# Patient Record
Sex: Female | Born: 1970 | Race: White | Hispanic: No | Marital: Married | Smoking: Former smoker
Health system: Southern US, Community
[De-identification: ages and names within clinical notes are randomized; demographics above are authoritative.]

## PROBLEM LIST (undated history)

## (undated) DIAGNOSIS — C801 Malignant (primary) neoplasm, unspecified: Secondary | ICD-10-CM

## (undated) DIAGNOSIS — F32A Depression, unspecified: Secondary | ICD-10-CM

## (undated) DIAGNOSIS — F329 Major depressive disorder, single episode, unspecified: Secondary | ICD-10-CM

## (undated) HISTORY — DX: Major depressive disorder, single episode, unspecified: F32.9

## (undated) HISTORY — DX: Depression, unspecified: F32.A

---

## 1997-07-10 ENCOUNTER — Other Ambulatory Visit: Admission: RE | Admit: 1997-07-10 | Discharge: 1997-07-10 | Payer: Self-pay | Admitting: Obstetrics and Gynecology

## 1997-10-22 ENCOUNTER — Ambulatory Visit (HOSPITAL_COMMUNITY): Admission: RE | Admit: 1997-10-22 | Discharge: 1997-10-22 | Payer: Self-pay | Admitting: Obstetrics & Gynecology

## 1998-03-13 ENCOUNTER — Inpatient Hospital Stay (HOSPITAL_COMMUNITY): Admission: AD | Admit: 1998-03-13 | Discharge: 1998-03-16 | Payer: Self-pay | Admitting: Obstetrics and Gynecology

## 1998-04-09 ENCOUNTER — Emergency Department (HOSPITAL_COMMUNITY): Admission: EM | Admit: 1998-04-09 | Discharge: 1998-04-09 | Payer: Self-pay | Admitting: Emergency Medicine

## 1999-03-12 ENCOUNTER — Other Ambulatory Visit: Admission: RE | Admit: 1999-03-12 | Discharge: 1999-03-12 | Payer: Self-pay | Admitting: Obstetrics and Gynecology

## 2000-06-30 ENCOUNTER — Other Ambulatory Visit: Admission: RE | Admit: 2000-06-30 | Discharge: 2000-06-30 | Payer: Self-pay | Admitting: Obstetrics and Gynecology

## 2001-08-18 ENCOUNTER — Other Ambulatory Visit: Admission: RE | Admit: 2001-08-18 | Discharge: 2001-08-18 | Payer: Self-pay | Admitting: Obstetrics and Gynecology

## 2002-09-19 ENCOUNTER — Inpatient Hospital Stay (HOSPITAL_COMMUNITY): Admission: AD | Admit: 2002-09-19 | Discharge: 2002-09-19 | Payer: Self-pay | Admitting: Obstetrics and Gynecology

## 2002-09-20 ENCOUNTER — Ambulatory Visit (HOSPITAL_COMMUNITY): Admission: RE | Admit: 2002-09-20 | Discharge: 2002-09-20 | Payer: Self-pay | Admitting: Obstetrics and Gynecology

## 2002-09-20 ENCOUNTER — Encounter: Payer: Self-pay | Admitting: Obstetrics and Gynecology

## 2002-10-17 ENCOUNTER — Inpatient Hospital Stay (HOSPITAL_COMMUNITY): Admission: AD | Admit: 2002-10-17 | Discharge: 2002-10-20 | Payer: Self-pay | Admitting: Obstetrics and Gynecology

## 2002-10-17 ENCOUNTER — Encounter (INDEPENDENT_AMBULATORY_CARE_PROVIDER_SITE_OTHER): Payer: Self-pay

## 2002-11-22 ENCOUNTER — Other Ambulatory Visit: Admission: RE | Admit: 2002-11-22 | Discharge: 2002-11-22 | Payer: Self-pay | Admitting: Obstetrics and Gynecology

## 2004-08-29 ENCOUNTER — Emergency Department (HOSPITAL_COMMUNITY): Admission: EM | Admit: 2004-08-29 | Discharge: 2004-08-30 | Payer: Self-pay | Admitting: Emergency Medicine

## 2005-12-15 ENCOUNTER — Ambulatory Visit: Payer: Self-pay | Admitting: Family Medicine

## 2006-02-13 ENCOUNTER — Encounter: Payer: Self-pay | Admitting: Family Medicine

## 2006-02-13 LAB — CONVERTED CEMR LAB: Pap Smear: NORMAL

## 2006-03-01 ENCOUNTER — Ambulatory Visit: Payer: Self-pay | Admitting: Family Medicine

## 2006-03-01 ENCOUNTER — Other Ambulatory Visit: Admission: RE | Admit: 2006-03-01 | Discharge: 2006-03-01 | Payer: Self-pay | Admitting: Family Medicine

## 2006-03-01 ENCOUNTER — Encounter: Payer: Self-pay | Admitting: Family Medicine

## 2006-04-05 ENCOUNTER — Ambulatory Visit: Payer: Self-pay | Admitting: Family Medicine

## 2006-04-05 LAB — CONVERTED CEMR LAB
AST: 27 units/L (ref 0–37)
BUN: 9 mg/dL (ref 6–23)
Calcium: 9.1 mg/dL (ref 8.4–10.5)
Chloride: 106 meq/L (ref 96–112)
Cholesterol: 193 mg/dL (ref 0–200)
Creatinine, Ser: 1 mg/dL (ref 0.4–1.2)
GFR calc Af Amer: 82 mL/min
GFR calc non Af Amer: 68 mL/min
HDL: 40.5 mg/dL (ref >39.0)
Potassium: 3.9 meq/L (ref 3.5–5.1)
TSH: 0.46 microintl units/mL (ref 0.35–5.50)
Triglycerides: 96 mg/dL (ref 0–149)
VLDL: 19 mg/dL (ref 0–40)

## 2006-04-22 ENCOUNTER — Ambulatory Visit: Payer: Self-pay | Admitting: Family Medicine

## 2006-10-07 ENCOUNTER — Encounter (INDEPENDENT_AMBULATORY_CARE_PROVIDER_SITE_OTHER): Payer: Self-pay | Admitting: *Deleted

## 2007-03-14 ENCOUNTER — Encounter: Payer: Self-pay | Admitting: Family Medicine

## 2007-03-14 DIAGNOSIS — F17200 Nicotine dependence, unspecified, uncomplicated: Secondary | ICD-10-CM

## 2007-03-14 DIAGNOSIS — Z720 Tobacco use: Secondary | ICD-10-CM | POA: Insufficient documentation

## 2007-03-18 ENCOUNTER — Ambulatory Visit: Payer: Self-pay | Admitting: Family Medicine

## 2008-05-23 ENCOUNTER — Ambulatory Visit: Payer: Self-pay | Admitting: Family Medicine

## 2008-12-20 ENCOUNTER — Ambulatory Visit: Payer: Self-pay | Admitting: Family Medicine

## 2009-04-16 ENCOUNTER — Ambulatory Visit: Payer: Self-pay | Admitting: Family Medicine

## 2009-04-16 LAB — HM PAP SMEAR

## 2009-04-16 LAB — CONVERTED CEMR LAB: Pap Smear: NORMAL

## 2009-07-08 ENCOUNTER — Ambulatory Visit: Payer: Self-pay | Admitting: Family Medicine

## 2009-07-08 LAB — CONVERTED CEMR LAB: CRP, High Sensitivity: 2.8 (ref 0.00–5.00)

## 2009-07-11 DIAGNOSIS — M329 Systemic lupus erythematosus, unspecified: Secondary | ICD-10-CM

## 2009-07-11 LAB — CONVERTED CEMR LAB
ANA Titer 1: 1:1280 {titer} — ABNORMAL HIGH
ENA SM Ab Ser-aCnc: <0.2 (ref <1.0)

## 2009-08-07 ENCOUNTER — Encounter: Payer: Self-pay | Admitting: Family Medicine

## 2009-11-22 ENCOUNTER — Telehealth (INDEPENDENT_AMBULATORY_CARE_PROVIDER_SITE_OTHER): Payer: Self-pay | Admitting: *Deleted

## 2009-11-22 ENCOUNTER — Ambulatory Visit: Payer: Self-pay | Admitting: Family Medicine

## 2009-11-23 LAB — CONVERTED CEMR LAB
ALT: 22 units/L (ref 0–35)
AST: 22 units/L (ref 0–37)
BUN: 9 mg/dL (ref 6–23)
Bilirubin, Direct: 0.1 mg/dL (ref 0.0–0.3)
CO2: 27 meq/L (ref 19–32)
Chloride: 106 meq/L (ref 96–112)
Cholesterol: 194 mg/dL (ref 0–200)
Creatinine, Ser: 0.7 mg/dL (ref 0.4–1.2)
Potassium: 4.1 meq/L (ref 3.5–5.1)
TSH: 0.64 microintl units/mL (ref 0.35–5.50)
Total Protein: 6.5 g/dL (ref 6.0–8.3)
Triglycerides: 103 mg/dL (ref 0.0–149.0)

## 2009-11-27 ENCOUNTER — Ambulatory Visit: Payer: Self-pay | Admitting: Family Medicine

## 2009-11-27 DIAGNOSIS — F341 Dysthymic disorder: Secondary | ICD-10-CM | POA: Insufficient documentation

## 2009-11-27 DIAGNOSIS — L989 Disorder of the skin and subcutaneous tissue, unspecified: Secondary | ICD-10-CM | POA: Insufficient documentation

## 2010-03-18 ENCOUNTER — Telehealth: Payer: Self-pay | Admitting: Family Medicine

## 2010-04-11 ENCOUNTER — Ambulatory Visit (HOSPITAL_COMMUNITY)
Admission: RE | Admit: 2010-04-11 | Discharge: 2010-04-11 | Payer: Self-pay | Source: Home / Self Care | Attending: Obstetrics and Gynecology | Admitting: Obstetrics and Gynecology

## 2010-04-11 LAB — BASIC METABOLIC PANEL
BUN: 7 mg/dL (ref 6–23)
Calcium: 9.2 mg/dL (ref 8.4–10.5)
GFR calc Af Amer: >60 mL/min (ref >60)
GFR calc non Af Amer: >60 mL/min (ref >60)
Sodium: 139 mEq/L (ref 135–145)

## 2010-04-11 LAB — CBC
HCT: 39.2 % (ref 36.0–46.0)
Hemoglobin: 13.2 g/dL (ref 12.0–15.0)
MCH: 30.2 pg (ref 26.0–34.0)
Platelets: 195 10*3/uL (ref 150–400)
RDW: 12.1 % (ref 11.5–15.5)

## 2010-04-11 LAB — APTT: aPTT: 30 seconds (ref 24–37)

## 2010-04-11 LAB — URINALYSIS, ROUTINE W REFLEX MICROSCOPIC
Bilirubin Urine: NEGATIVE
Nitrite: NEGATIVE
Protein, ur: NEGATIVE mg/dL
Urobilinogen, UA: 0.2 mg/dL (ref 0.0–1.0)

## 2010-04-11 LAB — PROTIME-INR: INR: 0.89 (ref 0.00–1.49)

## 2010-04-17 NOTE — Assessment & Plan Note (Signed)
Summary: SWELLING H ANDS/AND FEET/DLO   Vital Signs:  Patient profile:   40 year old female Height:      68 inches Weight:      173.8 pounds BMI:     26.52 Temp:     98.0 degrees F oral Pulse rate:   78 / minute Pulse rhythm:   regular BP sitting:   110 / 70  (left arm) Cuff size:   regular  Vitals Entered By: Benny Lennert CMA Duncan Dull) (July 08, 2009 10:50 AM)  History of Present Illness: Chief complaint swollen in hands and feet,achy joints(elbows,knees,ankles)  40 year old female:  Saturday -- fingers were swelling. Feet are swelling as well and she has some focal pain and bogginess in her MCP's and MTP joints.  Got some benadryl -- having pain This has improved somewhat since then, but still has some MCP pain No trauma or accident feet are swollen  FH, Mom with RA  Allergies (verified): No Known Drug Allergies  Past History:  Past medical, surgical, family and social histories (including risk factors) reviewed, and no changes noted (except as noted below).  Past Medical History: Reviewed history from 03/14/2007 and no changes required. Depression  Family History: Reviewed history and no changes required. Mother: Rheumatoid Arthritis  Denies FH of Psoriatic A, Inflammatory bowel disease, Lupus -- but mom's Lupus tests are mildly positive (mild ANA?), No seronegative spondyloarthropathies known  Social History: Reviewed history from 03/14/2007 and no changes required. Current Smoker, 1 PPD Alcohol use-yes, occasional Regular exercise-no Marital Status: Children:  Occupation:   Review of Systems General:  Denies chills and fever. MS:  Complains of joint pain, joint swelling, and stiffness; denies joint redness.  Physical Exam  General:  GEN: Well-developed,well-nourished,in no acute distress; alert,appropriate and cooperative throughout examination HEENT: Normocephalic and atraumatic without obvious abnormalities. No apparent alopecia or balding. Ears,  externally no deformities PULM: Breathing comfortably in no respiratory distress EXT: No clubbing, cyanosis, or edema PSYCH: Normally interactive. Cooperative during the interview. Pleasant. Friendly and conversant. Not anxious or depressed appearing. Normal, full affect.    Foot/Ankle Exam  Inspection:    Inspection reveals no deformity, ecchymosis. Mild dorsal swelling.   Wrist/Hand Exam  Inspection:    swelling: dorsum of feet and hands, tenosynovitis at MCP's diffusely.   Palpation:    TTP at MCP's, not so at MTP, but some mild pain with motion, particularly at 1st  Vascular:    Radial pulses 2+ and symmetric; capillary refill less than 2 seconds; no evidence of ischemia, clubbing, or cyanosis.    Sensory:    Gross sensation intact in the upper extremities.    Motor:    Normal strength in the upper extremities.    Hand Exam:    Right:    Inspection:  Normal    Palpation:  Normal    Tenderness:  multiple MCP    Swelling:  Multiple MCP    Erythema:  no    Left:    Inspection:  Normal    Palpation:  Normal    Tenderness:  Multiple MCP    Swelling:  Multiple MCP    Erythema:  no  Snuff Box Tenderness:    Right negative; Left negative   Impression & Recommendations:  Problem # 1:  PAIN IN JOINT, MULTIPLE SITES (ICD-719.49) Assessment New Polyarthropathy of unclear cause with associated synovitis in a patient with mother with RA eval for Rheum disease.  Orders: Venipuncture (04540) T- Specimen Handling (98119) TLB-CRP-High Sensitivity (C-Reactive  Protein) (86140-FCRP) TLB-Rheumatoid Factor (RA) (56387-FI) TLB-Sedimentation Rate (ESR) (85652-ESR) T-ENA Panel (86038/[address]) T- * Misc. Laboratory test [phone])  Patient Instructions: 1)  Go to the lab  Current Allergies (reviewed today): No known allergies   Appended Document: SWELLING H ANDS/AND FEET/DLO

## 2010-04-17 NOTE — Progress Notes (Signed)
Summary: wants script for chantix  Phone Note Call from Patient Call back at Home Phone [phone]   Caller: Patient Call For: Kerby Nora MD Summary of Call: Pt would like to start chantix.  She says she has discussed this with you in the past.  She would like to pickup written script tomorrow and would also like to pick up a coupon, since her insurance does not cover this. Initial call taken by: Lowella Petties CMA, AAMA,  March 18, 2010 10:33 AM  Follow-up for Phone Call        up front for patient to pick up tomorrow Follow-up by: Benny Lennert CMA (AAMA),  March 18, 2010 2:03 PM    New/Updated Medications: CHANTIX STARTING MONTH PAK 0.5 MG X 11 & 1 MG X 42 TABS (VARENICLINE TARTRATE) as directed CHANTIX CONTINUING MONTH PAK 1 MG TABS (VARENICLINE TARTRATE) 1 tab po  two times a day Prescriptions: CHANTIX CONTINUING MONTH PAK 1 MG TABS (VARENICLINE TARTRATE) 1 tab po  two times a day  #1 pack x 5   Entered and Authorized by:   Kerby Nora MD   Signed by:   Kerby Nora MD on 03/18/2010   Method used:   Print then Give to Patient   RxID:   [id number] CHANTIX STARTING MONTH PAK 0.5 MG X 11 & 1 MG X 42 TABS (VARENICLINE TARTRATE) as directed  #1 pack x 0   Entered and Authorized by:   Kerby Nora MD   Signed by:   Kerby Nora MD on 03/18/2010   Method used:   Print then Give to Patient   RxID:   [id number]

## 2010-04-17 NOTE — Assessment & Plan Note (Signed)
Summary: CPX/WPAP/RBH   Vital Signs:  Patient profile:   40 year old female Height:      68 inches Weight:      153.0 pounds BMI:     23.35 Temp:     98.9 degrees F oral Pulse rate:   78 / minute Pulse rhythm:   regular BP sitting:   120 / 76  (left arm) Cuff size:   regular  Vitals Entered By: Benny Lennert CMA Duncan Dull) (November 27, 2009 2:52 PM)  History of Present Illness: Chief complaint cpx  The patient is here for annual wellness exam and preventative care.      High ANA.Marland Kitchenseen Dr. Kellie Simmering...possible lupus Swelling in ands and feet in 06/2009...but no symptoms since 07/2009 No skin rash, feeling well.   Mennorhagia...planned ablation per GYN. No lightheadheness.  Fatigue. Walking 3-4 times a week.   Anxiety, poor control...was on effexor in past, very helpful stopped because was doing well. Sleeping well at night, no depression.    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  Past Medical History: Reviewed history from 03/14/2007 and no changes required. Depression  Family History: Reviewed history from 07/08/2009 and no changes required. Mother: Rheumatoid Arthritis  Denies FH of Psoriatic A, Inflammatory bowel disease, Lupus -- but mom's  Lupus tests are mildly positive (mild ANA?), No seronegative spondyloarthropathies known  Social History: Reviewed history from 03/14/2007 and no changes required. Current Smoker, 1 PPD Alcohol use-yes, occasional Regular exercise-no Marital Status: Children:  Occupation:   Review of Systems General:  Complains of fatigue; denies fever and loss of appetite. CV:  Denies chest pain or discomfort. Resp:  Denies shortness of breath. GI:  Denies abdominal pain. GU:  Complains of abnormal vaginal bleeding; denies dysuria.  Physical Exam  General:  Well-developed,well-nourished,in no acute distress; alert,appropriate and cooperative throughout examination Breasts:  per gyn Lungs:  Normal respiratory effort, chest expands symmetrically. Lungs are clear to auscultation, no crackles or wheezes. Heart:  Normal rate and regular rhythm. S1 and S2 normal without gallop, murmur, click, rub or other extra sounds. Abdomen:  Bowel sounds positive,abdomen soft and non-tender without masses, organomegaly or hernias noted. Genitalia:  per gyn Msk:  several dark moles one irregular on arm...return to [facility] for eval/bx. Pulses:  R and L posterior tibial pulses are full and equal bilaterally  Extremities:  no edmea  Skin:  Intact without suspicious lesions or rashes Psych:  Cognition and judgment appear intact. Alert and cooperative with normal attention span and concentration. No apparent delusions, illusions, hallucinations   Impression & Recommendations:  Problem # 1:  DEPRESSION/ANXIETY (ICD-300.4) Inadequate control...restart effexor, titrate up. Not interested in counseling. Work on stress reduction, relaxation.  Problem # 2:  SKIN LESIONS, MULTIPLE (ICD-709.9) Return to derm...right arm lesion needs to be biopsied given irregularity, size and new  change.   Problem # 3:  Preventive Health Care (ICD-V70.0) Assessment: Comment Only The patient's preventative maintenance and  recommended screening tests for an annual wellness exam were reviewed in full today. Brought up to date unless services declined.  Counselled on the importance of diet, exercise, and its role in overall health and mortality. The patient's FH and SH was reviewed, including their home life, tobacco status, and drug and alcohol status.     Problem # 4:  SYSTEMIC LUPUS ERYTHEMATOSUS (ICD-710.0) Resolved symtpoms..return to [facility] as needed.   Complete Medication List: 1)  Venlafaxine Hcl 37.5 Mg Xr24h-cap (Venlafaxine hcl) .Marland Kitchen.. 1 tab by mouth at bedtime , after 1 week increase to 2 tabs by mouth daily  Patient Instructions: 1)  Work on low cholesterol diet.Marland Kitchendecrease fast food. 2)   Increase exercsie.  3)  Please schedule a follow-up appointment in 1 year.  4)  Tobacco is very bad for your health and your loved ones ! You should stop smoking !  5)  Stop smoking tips: Choose a quit date. Cut down before the quit date. Decide what you will do as a substitute when you feel the urge to smoke(gum, toothpick, exercise).  6)  Call if interested in chantix or wellbutrin. 7)  Call if due for tentnus on home records. 8)  Make appt to see Derm for skin check.  Prescriptions: VENLAFAXINE HCL 37.5 MG XR24H-CAP (VENLAFAXINE HCL) 1 tab by mouth at bedtime , after 1 week increase to 2 tabs by mouth daily  #60 x 3   Entered and Authorized by:   Kerby Nora MD   Signed by:   Kerby Nora MD on 11/27/2009   Method used:   [facility]       [address]       [place], [place]  [address]       Ph: [phone]       Fax: [phone]   RxID:   [id number]   Prior Medications (reviewed today): None Current Allergies (reviewed today): No known allergies   Flu Vaccine Next Due:  Refused Last PAP:  Normal (02/13/2006 5:04:32 PM) PAP Result Date:  04/16/2009 PAP Result:  normal PAP Next Due:  1 yr    Past Medical  History:    Reviewed history from 03/14/2007 and no changes required:       Depression

## 2010-04-17 NOTE — Consult Note (Signed)
Summary: Stacey Drain MD Rheumatology  Stacey Drain MD Rheumatology   Imported By: Lanelle Bal 08/23/2009 08:38:29  _____________________________________________________________________  External Attachment:    Type:   Image     Comment:   External Document  Appended Document: Stacey Drain MD Rheumatology Very high ANA, + DS DNA  MCP swelling resolved  cannot make dx SLE at this point - but f/u 2 months, with recurrent symptoms at this point, would certainly consider this.

## 2010-04-17 NOTE — Progress Notes (Signed)
----   Converted from flag ---- ---- 11/22/2009 9:28 AM, Kerby Nora MD wrote: Dx v77.91 LIPIDs, CMET, 300.00 TSH  ---- 11/20/2009 1:10 PM, Liane Comber CMA (AAMA) wrote: Lab orders please! Good Morning! This pt is scheduled for cpx labs Friday, which labs to draw and dx codes to use? Thanks Tasha ------------------------------

## 2010-04-18 NOTE — Op Note (Signed)
[name], [name]               ACCOUNT NO.:  [id number]  MEDICAL RECORD NO.:  [id number]          PATIENT TYPE:  AMB  LOCATION:  SDC                           FACILITY:  [facility]  PHYSICIAN:  Miguel Aschoff, M.D.       DATE OF BIRTH:  Feb 14, 1971  DATE OF PROCEDURE:  04/11/2010 DATE OF DISCHARGE:  04/11/2010                              OPERATIVE REPORT   PREOPERATIVE DIAGNOSIS:  Menorrhagia.  POSTOPERATIVE DIAGNOSIS:  Menorrhagia.  PROCEDURES:  Cervical dilatation, hysteroscopy, uterine curettage followed by NovaSure endometrial ablation.  SURGEON:  Miguel Aschoff, MD  ANESTHESIA:  General.  COMPLICATIONS:  None.  JUSTIFICATION:  The patient is a 40 year old white female with history of progressively worsening menorrhagia.  Attempts were made to control the bleeding as an outpatient using medical therapy.  However, because of the persistence of this problem and the inconveniences that is causing her because of the heavy flow, she presents now to undergo definitive treatment via endometrial ablation.  The risks and benefits of this procedure were discussed with the patient as well as alternatives which included birth control pills Lysteda and a Mirena IUD.  With this information, the informed consent has been obtained for endometrial ablation.  PROCEDURE:  The patient was taken to the operating room, placed in supine position.  General anesthesia was administered without difficulty.  She was then placed in dorsal lithotomy position, prepped and draped in the usual sterile fashion.  Once this was done, the bladder was catheterized.  Examination under anesthesia was carried out which revealed normal external genitalia and normal Bartholin Skene glands and normal urethra.  The vaginal vault was without gross lesion. The cervix was without gross lesion.  The uterus was noted to be retroflexed, top normal size, globular fundus, but smooth and regular. Otherwise, no adnexal masses were  noted.  At this point, the anterior cervical lip was grasped with tenaculum.  The endometrial cavity was then sounded to 9-cm.  The cervical length of 4-cm was then measured. After this was done, the cervix was dilated using serial Pratt dilators until a #27 Pratt dilator could be passed without difficulty.  Once this was done, the diagnostic hysteroscope was advanced through the endocervical canal.  No endocervical lesions were noted.  On entering the endometrial cavity, there appeared to be moderate amount of normal- appearing endometrial tissue present, but no submucous myomas or polyps were found.  Once this was done, sharp vigorous curettage was carried out using a medium-size serrated curette.  The specimens obtained were then sent for histologic study.  On completion of the curettage, the NovaSure endometrial ablation unit was advanced into the uterus.  The array was opened with 4-cm cavity width.  Cavity assessment was then carried out and passed.  Once this was done, the treatment cycle was then carried out at 110 watts for 1 minute and 27 seconds.  On completion of the treatment cycle, the endometrial ablation unit was removed intact.  The hysteroscope was then re-advanced to ensure good coagulation of the cavity and this appeared to be the case.  At this point, the procedure was completed.  The cervix was injected with total of 10 mL of 1% Xylocaine by placing equal amounts at the 9 and 3 o'clock positions on the cervix.  There was a small tear noted on the anterior cervical lip secondary to the tenaculum and this was easily repaired with 2 sutures of 2-0 Vicryl.  The estimated blood loss from total procedure was less than 30 mL.  The patient tolerated the procedure well and went to the recovery room in satisfactory condition.  Plan is for the patient to be discharged home.  Medications for home include doxycycline one twice a day x3 days, Ultram one three times a day as  needed for pain and for mild pain, the patient is instructed to use Tylenol and Motrin.  The patient will be seen back in 4 weeks for followup examination.  She is sent home in satisfactory condition.     Miguel Aschoff, M.D.     AR/MEDQ  D:  04/11/2010  T:  04/12/2010  Job:  [id number]  Electronically Signed by Miguel Aschoff M.D. on 04/18/2010 06:44:43 AM

## 2010-08-01 NOTE — Op Note (Signed)
[name], [name]                         ACCOUNT NO.:  [id number]   MEDICAL RECORD NO.:  [id number]                   PATIENT TYPE:  INP   LOCATION:  9132                                 FACILITY:  [facility]   PHYSICIAN:  Miguel Aschoff, M.D.                    DATE OF BIRTH:  [birth year]   DATE OF PROCEDURE:  10/17/2002  DATE OF DISCHARGE:                                 OPERATIVE REPORT   PREOPERATIVE DIAGNOSES:  1. Intrauterine twin gestation at 34-5/7 weeks with spontaneous rupture of     membranes, spontaneous onset of uterine contractions.  2. Status post previous cesarean section.   POSTOPERATIVE DIAGNOSES:  1. Intrauterine twin gestation at 34-5/7 weeks with spontaneous rupture of     membranes, spontaneous onset of uterine contractions.  2. Status post previous cesarean section.  3. Delivery of viable female infants, Apgars 8-9 and 8-9.   PROCEDURE:  Repeat low flat transverse cesarean section.   SURGEON:  Miguel Aschoff, M.D.   ANESTHESIA:  General anesthesia.   COMPLICATIONS:  None.   INDICATIONS FOR PROCEDURE:  The patient is a 40 year old white female  gravida 2, para 1-0-0-1 with an estimated date of confinement of November 25, 2002.  The patient had spontaneous rupture of membranes this evening and  presented to the [facility] having uterine contractions, positive  Nitrazine fluid and with previous history of cesarean section, now with  twins in labor, she is being taken to the operating room at this time to  undergo a repeat cesarean section.  Risks and benefits of the procedure were  discussed with the patient.   DESCRIPTION OF PROCEDURE:  The patient was taken to the operating room,  placed in the supine position.  Spinal anesthesia was administered without  difficulty.  With the patient in a supine position, prepped and draped in  the usual sterile fashion, a Foley catheter was inserted.  After  satisfactory level of spinal anesthesia was achieved.   Pfannenstiel incision  was made, extending down through subcutaneous tissue with bleeding points  being clamped and coagulated as they were encountered.  Fascia was then  identified and entered carefully avoiding underlying structures.  The  peritoneal incision extended.  Bladder flap was created and protected with  the bladder blade.  An elliptical transverse incision was made into the  lower uterine segment.  Amniotic cavity was then entered on baby A with  clear fluid being obtained.  He presented as vertex LOA and was delivered  without difficulty.  The Apgar of this baby was 8 at one minute and 9 at  five minutes and the baby's weight was 5 pounds 3 ounces.  Baby was handed  to the neonatal team in attendance.  Cord was tagged for later obtaining  cord bloods.  The second sac was then ruptured.  This baby also was  presenting as vertex ROA and  delivery of this infant with Apgar score of 8  at one minute and 9 at five minutes.  The baby's weight was 5 pounds 2  ounces.  Cord pHs were obtained.  The pH of baby A was 7.34, the pH of baby  B was 7.37.  After this, cord bloods were obtained and sent to the lab for  routine neonatal labs.  The placenta was then removed and sent to pathology  for further examination.  The uterus was then inspected and evacuating of  remaining products of conception.  Then the angles of the uterine incision  were identified and ligated using figure of eight sutures of #1 Vicryl.  Uterus was then closed in layers.  The first layer was a running  interlocking suture with #1 Vicryl followed by an imbricating suture of #1  Vicryl.  The bladder flap was then reapproximated using running continuous 2-  0 Vicryl suture.  Tubes and ovaries were inspected and noted to be within  normal limits.  At this point, with no abnormalities being noted, lap counts  and instrument counts were taken and found to be correct and the abdomen was  closed. The parietal peritoneum was  closed using running continuous 0 Vicryl  suture.  Rectus muscles were reapproximated using running continuous 0  Vicryl suture.  Fascia was then closed using two sutures of 0 Vicryl in ____  fascial angles and meeting in the midline.  Subcutaneous tissue was closed  using interrupted 0 Vicryl sutures and the skin incision was closed using  staples.  The estimated blood loss was approximately 600 mL.  The patient  tolerated the procedure well and went to the recovery room in satisfactory  condition.                                               Miguel Aschoff, M.D.    AR/MEDQ  D:  10/17/2002  T:  10/18/2002  Job:  [id number]

## 2010-08-01 NOTE — Discharge Summary (Signed)
   [name], [name]                         ACCOUNT NO.:  [id number]   MEDICAL RECORD NO.:  [id number]                   PATIENT TYPE:  INP   LOCATION:  9132                                 FACILITY:  [facility]   PHYSICIAN:  Carrington Clamp, M.D.              DATE OF BIRTH:  [birth year]   DATE OF ADMISSION:  10/17/2002  DATE OF DISCHARGE:  10/20/2002                                 DISCHARGE SUMMARY   ADMISSION DIAGNOSES:  Twins at 87 and 5/7th's weeks with premature rupture  of membranes.   DISCHARGE DIAGNOSES:  Twins at 95 and 5/7th's weeks with premature rupture  of membranes.   PERTINENT PROCEDURES PERFORMED:  Cesarean section at 34 plus weeks for  premature rupture of membranes.   PERTINENT TEST RESULTS:  Preoperative H&H of 13 and 39, and postoperative  H&H is 7.3 and 30.5.   HISTORY AND PHYSICAL:  The history and physical is on the chart, but briefly  this is a 40 year old G2, P1-0-0-1 at 35 and 5/7th's weeks who had  spontaneous rupture of membranes on October 17, 2002 and had a history of a  cesarean section, therefore the patient was for a repeat cesarean section  for premature rupture of membranes.   HOSPITAL COURSE:  The patient was admitted on October 17, 2002 and underwent  the repeat low transverse caesarian section with the delivery of two viable  female infants, vertex/vertex, with Apgar's of eight and nine each.  Baby A  weighed 5 pounds 3 ounces and Baby B weighed 5 pounds 2 ounces.  The patient  did well postoperatively and by operative day #3 she was eating, ambulating,  voiding without problems.  Both female babies were circumcised after the  parents consented and risks were discussed with them.  The patient was then  discharged to home on postoperative day #3.   DISCHARGE MEDICATIONS:  1. Percocet 5 mg 1 p.o. q.4-6h. p.r.n. pain.  2. Hemocyte plus for iron   FOLLOW UP:  The patient was to return in two weeks at [facility].   DISCHARGE ACTIVITIES:   Routine activity was desired including pelvic rest  for six weeks, no heavy lifting for six weeks and no driving for two weeks.   DISPOSITION:  Both babies were discharged with the mother.                                               Carrington Clamp, M.D.    MH/MEDQ  D:  12/06/2002  T:  12/06/2002  Job:  [id number]

## 2011-03-16 ENCOUNTER — Encounter: Payer: Self-pay | Admitting: Family Medicine

## 2011-03-18 ENCOUNTER — Encounter: Payer: Self-pay | Admitting: Family Medicine

## 2011-03-18 ENCOUNTER — Ambulatory Visit (INDEPENDENT_AMBULATORY_CARE_PROVIDER_SITE_OTHER): Payer: 59 | Admitting: Family Medicine

## 2011-03-18 VITALS — BP 120/82 | HR 95 | Temp 98.1°F | Ht 68.0 in | Wt 169.1 lb

## 2011-03-18 DIAGNOSIS — J0100 Acute maxillary sinusitis, unspecified: Secondary | ICD-10-CM

## 2011-03-18 MED ORDER — AMOXICILLIN 500 MG PO CAPS: 1000.0000 mg | ORAL_CAPSULE | Freq: Two times a day (BID) | ORAL | Status: AC

## 2011-03-18 MED ORDER — FLUTICASONE PROPIONATE 50 MCG/ACT NA SUSP: 2.0000 | Freq: Every day | NASAL | Status: DC

## 2011-03-18 NOTE — Progress Notes (Signed)
  Patient Name: [name] Date of Birth: 1970/09/24 Age: 41 y.o. Medical Record Number: [id number] Gender: female Date of Encounter: 03/18/2011  History of Present Illness:  [name] is a 41 y.o. very pleasant female patient who presents with the following:  Left nostril, feels like it is stuff up. Feels swolen, not much coming out. Some congestion. No fever. Allegra, zyrtec, claritin.  Facial discomfort on L eear fullness No fever  Past Medical History, Surgical History, Social History, Family History, Problem List, Medications, and Allergies have been reviewed and updated if relevant.  Review of Systems: ROS: GEN: Acute illness details above GI: Tolerating PO intake GU: maintaining adequate hydration and urination Pulm: No SOB Interactive and getting along well at home.  Otherwise, ROS is as per the HPI.   Physical Examination: Filed Vitals:   03/18/11 0819  BP: 120/82  Pulse: 95  Temp: 98.1 F (36.7 C)  TempSrc: Oral  Height: 5\' 8"  (1.727 m)  Weight: 169 lb 1.9 oz (76.712 kg)  SpO2: 100%    Body mass index is 25.71 kg/(m^2).   Gen: WDWN, NAD; alert,appropriate and cooperative throughout exam  HEENT: Normocephalic and atraumatic. Throat clear, w/o exudate, no LAD, R TM clear, L TM - good landmarks, No fluid present. rhinnorhea.  Left frontal and maxillary sinuses: Tender max Right frontal and maxillary sinuses: Tender  Neck: No ant or post LAD CV: RRR, No M/G/R Pulm: Breathing comfortably in no resp distress. no w/c/r Abd: S,NT,ND,+BS Extr: no c/c/e Psych: full affect, pleasant   Assessment and Plan: 1. Sinusitis, acute maxillary  amoxicillin (AMOXIL) 500 MG capsule, fluticasone (FLONASE) 50 MCG/ACT nasal spray

## 2011-05-06 ENCOUNTER — Encounter: Payer: Self-pay | Admitting: Family Medicine

## 2011-05-06 ENCOUNTER — Ambulatory Visit (INDEPENDENT_AMBULATORY_CARE_PROVIDER_SITE_OTHER): Payer: 59 | Admitting: Family Medicine

## 2011-05-06 VITALS — BP 102/70 | HR 80 | Temp 98.0°F | Ht 68.0 in | Wt 169.4 lb

## 2011-05-06 DIAGNOSIS — Z0000 Encounter for general adult medical examination without abnormal findings: Secondary | ICD-10-CM

## 2011-05-06 DIAGNOSIS — F17200 Nicotine dependence, unspecified, uncomplicated: Secondary | ICD-10-CM

## 2011-05-06 LAB — CBC WITH DIFFERENTIAL/PLATELET
Basophils Absolute: 0.1 10*3/uL (ref 0.0–0.1)
Eosinophils Absolute: 0.1 10*3/uL (ref 0.0–0.7)
Eosinophils Relative: 0.7 % (ref 0.0–5.0)
HCT: 41.2 % (ref 36.0–46.0)
Lymphs Abs: 2.3 10*3/uL (ref 0.7–4.0)
MCV: 93.2 fl (ref 78.0–100.0)
Monocytes Absolute: 0.4 10*3/uL (ref 0.1–1.0)
Neutrophils Relative %: 59.9 % (ref 43.0–77.0)
Platelets: 231 10*3/uL (ref 150.0–400.0)
RDW: 12.5 % (ref 11.5–14.6)
WBC: 7.1 10*3/uL (ref 4.5–10.5)

## 2011-05-06 LAB — HEPATIC FUNCTION PANEL
AST: 18 U/L (ref 0–37)
Albumin: 4.1 g/dL (ref 3.5–5.2)
Alkaline Phosphatase: 49 U/L (ref 39–117)
Bilirubin, Direct: 0 mg/dL (ref 0.0–0.3)
Total Bilirubin: 0.5 mg/dL (ref 0.3–1.2)
Total Protein: 7.2 g/dL (ref 6.0–8.3)

## 2011-05-06 LAB — BASIC METABOLIC PANEL
BUN: 9 mg/dL (ref 6–23)
Chloride: 103 mEq/L (ref 96–112)
Creatinine, Ser: 0.9 mg/dL (ref 0.4–1.2)
Glucose, Bld: 77 mg/dL (ref 70–99)
Potassium: 4.6 mEq/L (ref 3.5–5.1)

## 2011-05-06 LAB — LDL CHOLESTEROL, DIRECT: Direct LDL: 148.8 mg/dL

## 2011-05-06 MED ORDER — BUPROPION HCL ER (SR) 150 MG PO TB12: 150.0000 mg | ORAL_TABLET | Freq: Two times a day (BID) | ORAL | Status: DC

## 2011-05-06 NOTE — Progress Notes (Signed)
Patient Name: [name] Date of Birth: January 13, 1971 Medical Record Number: [id number] Gender: female Date of Encounter: 05/06/2011  History of Present Illness:  [name] is a 41 y.o. very pleasant female patient who presents with the following:  Wants to quit smoking.   Has been on effexor in the past  Has GYN appt tomorrow.   Health Maintenance Summary Reviewed and updated, unless pt declines services.  Tobacco History Reviewed. Non-smoker Alcohol: No concerns, no excessive use Exercise Habits: Some activity, rec at least 30 mins 5 times a week - likes to walk, but not much now STD concerns: none Drug Use: None Lumps or breast concerns: no Breast Cancer Family History: no  Health Maintenance  Topic Date Due  . Tetanus/tdap  05/30/1991  . Influenza Vaccine  12/15/2010  . Pap Smear  04/16/2012    Patient Active Problem List  Diagnoses  . DEPRESSION/ANXIETY  . TOBACCO ABUSE  . SKIN LESIONS, MULTIPLE  . SYSTEMIC LUPUS ERYTHEMATOSUS  This patient does not have lupus  Past Medical History  Diagnosis Date  . Depression    No past surgical history on file. History  Substance Use Topics  . Smoking status: Current Everyday Smoker  . Smokeless tobacco: Not on file  . Alcohol Use: Yes   Family History  Problem Relation Age of Onset  . Rheum arthritis Mother    No Known Allergies  Medication list has been reviewed and updated.  Review of Systems:  General: Denies fever, chills, sweats. No significant weight loss. Eyes: Denies blurring,significant itching ENT: Denies earache, sore throat, and hoarseness.  Cardiovascular: Denies chest pains, palpitations, dyspnea on exertion,  Respiratory: Denies cough, dyspnea at rest,wheeezing Breast: no concerns about lumps GI: Denies nausea, vomiting, diarrhea, constipation, change in bowel habits, abdominal pain, melena, hematochezia GU: Denies dysuria, hematuria, urinary hesitancy, nocturia, denies STD risk, no  concerns about discharge Musculoskeletal: Denies back pain, joint pain Derm: Denies rash, itching Neuro: Denies  paresthesias, frequent falls, frequent headaches Psych: Denies depression, anxiety Endocrine: Denies cold intolerance, heat intolerance, polydipsia Heme: Denies enlarged lymph nodes Allergy: No hayfever  Physical Examination: Filed Vitals:   05/06/11 0818  BP: 102/70  Pulse: 80  Temp: 98 F (36.7 C)  TempSrc: Oral  Height: 5\' 8"  (1.727 m)  Weight: 169 lb 6.4 oz (76.839 kg)  SpO2: 100%    Body mass index is 25.76 kg/(m^2).   Wt Readings from Last 3 Encounters:  05/06/11 169 lb 6.4 oz (76.839 kg)  03/18/11 169 lb 1.9 oz (76.712 kg)  11/27/09 153 lb (69.4 kg)    GEN: well developed, well nourished, no acute distress Eyes: conjunctiva and lids normal, PERRLA, EOMI ENT: TM clear, nares clear, oral exam WNL Neck: supple, no lymphadenopathy, no thyromegaly, no JVD Pulm: clear to auscultation and percussion, respiratory effort normal CV: regular rate and rhythm, S1-S2, no murmur, rub or gallop, no bruits Chest: no scars, masses, no lumps BREAST: defer GI: soft, non-tender; no hepatosplenomegaly, masses; active bowel sounds all quadrants GU: defer Lymph: no cervical, axillary or inguinal adenopathy MSK: gait normal, muscle tone and strength WNL, no joint swelling, effusions, discoloration, crepitus  SKIN: clear, good turgor, color WNL, no rashes, lesions, or ulcerations Neuro: normal mental status, normal strength, sensation, and motion Psych: alert; oriented to person, place and time, normally interactive and not anxious or depressed in appearance.   Assessment and Plan: 1. Routine general medical examination at a health care facility  Basic metabolic panel, CBC with Differential, Hepatic  function panel, Lipid panel, TSH  2. TOBACCO ABUSE  buPROPion (WELLBUTRIN SR) 150 MG 12 hr tablet    The patient's preventative maintenance and recommended screening tests for  an annual wellness exam were reviewed in full today. Brought up to date unless services declined.  Counselled on the importance of diet, exercise, and its role in overall health and mortality. The patient's FH and SH was reviewed, including their home life, tobacco status, and drug and alcohol status.   5 minutes spent in counselling: The patient does smoke cigarettes, and we discussed that tobacco is harmful to one's overall health, and I made the recommendation to quit tobacco products. Start wellbutrin. Also with history of dep in past, now controlled

## 2011-12-31 ENCOUNTER — Ambulatory Visit (INDEPENDENT_AMBULATORY_CARE_PROVIDER_SITE_OTHER): Payer: 59 | Admitting: Family Medicine

## 2011-12-31 ENCOUNTER — Encounter: Payer: Self-pay | Admitting: Family Medicine

## 2011-12-31 VITALS — BP 120/80 | HR 92 | Temp 99.1°F | Resp 20 | Ht 68.0 in | Wt 173.0 lb

## 2011-12-31 DIAGNOSIS — J069 Acute upper respiratory infection, unspecified: Secondary | ICD-10-CM | POA: Insufficient documentation

## 2011-12-31 NOTE — Progress Notes (Signed)
  Subjective:    Patient ID: [name], female    DOB: Dec 03, 1970, 40 y.o.   MRN: [id number]  Cough This is a new problem. The current episode started yesterday. The problem has been gradually worsening. The cough is productive of sputum. Associated symptoms include chest pain, ear congestion, nasal congestion, postnasal drip and a sore throat. Pertinent negatives include no chills, ear pain, fever, myalgias, rash, shortness of breath or wheezing. Nothing aggravates the symptoms. Risk factors for lung disease include smoking/tobacco exposure. Treatments tried: motrin and mucinex. The treatment provided mild relief. Her past medical history is significant for environmental allergies. There is no history of COPD, emphysema or pneumonia.      Review of Systems  Constitutional: Negative for fever and chills.  HENT: Positive for sore throat and postnasal drip. Negative for ear pain.   Respiratory: Positive for cough. Negative for shortness of breath and wheezing.   Cardiovascular: Positive for chest pain.  Musculoskeletal: Negative for myalgias.  Skin: Negative for rash.  Hematological: Positive for environmental allergies.       Objective:   Physical Exam  Constitutional: Vital signs are normal. She appears well-developed and well-nourished. She is cooperative.  Non-toxic appearance. She does not appear ill. No distress.  HENT:  Head: Normocephalic.  Right Ear: Hearing, tympanic membrane, external ear and ear canal normal. Tympanic membrane is not erythematous, not retracted and not bulging.  Left Ear: Hearing, tympanic membrane, external ear and ear canal normal. Tympanic membrane is not erythematous, not retracted and not bulging.  Nose: Mucosal edema and rhinorrhea present. Right sinus exhibits no maxillary sinus tenderness and no frontal sinus tenderness. Left sinus exhibits no maxillary sinus tenderness and no frontal sinus tenderness.  Mouth/Throat: Uvula is midline, oropharynx is  clear and moist and mucous membranes are normal.  Eyes: Conjunctivae normal, EOM and lids are normal. Pupils are equal, round, and reactive to light. No foreign bodies found.  Neck: Trachea normal and normal range of motion. Neck supple. Carotid bruit is not present. No mass and no thyromegaly present.  Cardiovascular: Normal rate, regular rhythm, S1 normal, S2 normal, normal heart sounds, intact distal pulses and normal pulses.  Exam reveals no gallop and no friction rub.   No murmur heard. Pulmonary/Chest: Effort normal and breath sounds normal. Not tachypneic. No respiratory distress. She has no decreased breath sounds. She has no wheezes. She has no rhonchi. She has no rales.  Neurological: She is alert.  Skin: Skin is warm, dry and intact. No rash noted.  Psychiatric: Her speech is normal and behavior is normal. Judgment normal. Her mood appears not anxious. Cognition and memory are normal. She does not exhibit a depressed mood.          Assessment & Plan:

## 2011-12-31 NOTE — Patient Instructions (Addendum)
Recommend mucinex Dm for cough. Call if cough not controlled at night. Nasal saline to help ears drain, can use flonase as well. Rest and hydration. Expect 7- 10 days of illness.

## 2011-12-31 NOTE — Assessment & Plan Note (Signed)
Symptomatic care 

## 2013-05-17 ENCOUNTER — Other Ambulatory Visit: Payer: Self-pay | Admitting: Obstetrics and Gynecology

## 2014-04-11 ENCOUNTER — Other Ambulatory Visit: Payer: Self-pay | Admitting: Obstetrics and Gynecology

## 2014-04-12 LAB — CYTOLOGY - PAP

## 2016-04-24 ENCOUNTER — Encounter: Payer: Self-pay | Admitting: Family Medicine

## 2016-04-24 ENCOUNTER — Other Ambulatory Visit: Payer: Self-pay | Admitting: Obstetrics & Gynecology

## 2016-04-24 DIAGNOSIS — R928 Other abnormal and inconclusive findings on diagnostic imaging of breast: Secondary | ICD-10-CM

## 2016-04-29 ENCOUNTER — Ambulatory Visit
Admission: RE | Admit: 2016-04-29 | Discharge: 2016-04-29 | Disposition: A | Discharge status: Home / Self Care | Payer: Commercial Managed Care - HMO | Source: Ambulatory Visit | Attending: Obstetrics & Gynecology | Admitting: Obstetrics & Gynecology

## 2016-04-29 DIAGNOSIS — R928 Other abnormal and inconclusive findings on diagnostic imaging of breast: Secondary | ICD-10-CM

## 2018-02-08 IMAGING — MG 2D DIGITAL DIAGNOSTIC UNILATERAL LEFT MAMMOGRAM WITH CAD AND ADJ
6 series · 6 of 14 positions shown · non-contrast
Comparison: Previous exam(s).

CLINICAL DATA: Left lateral subareolar breast mass seen on most
recent screening mammography.

EXAM:
2D DIGITAL DIAGNOSTIC LEFT MAMMOGRAM WITH CAD AND ADJUNCT TOMO
ULTRASOUND LEFT BREAST

[L MLO synth-2D]
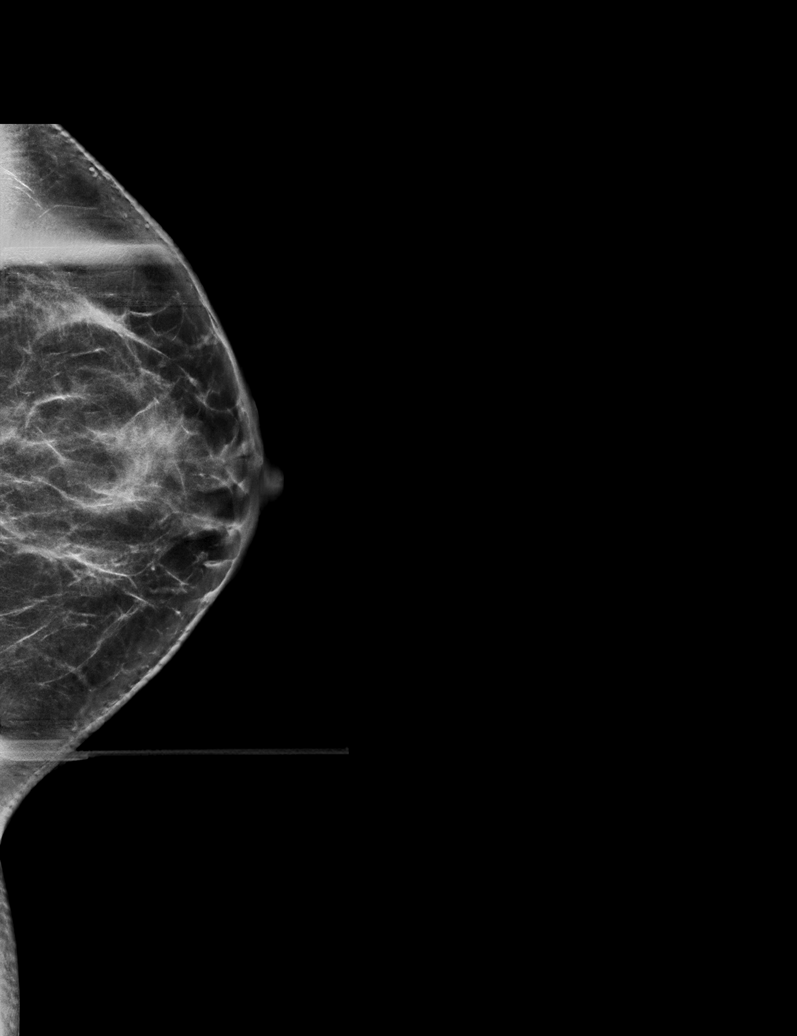

[L CC]
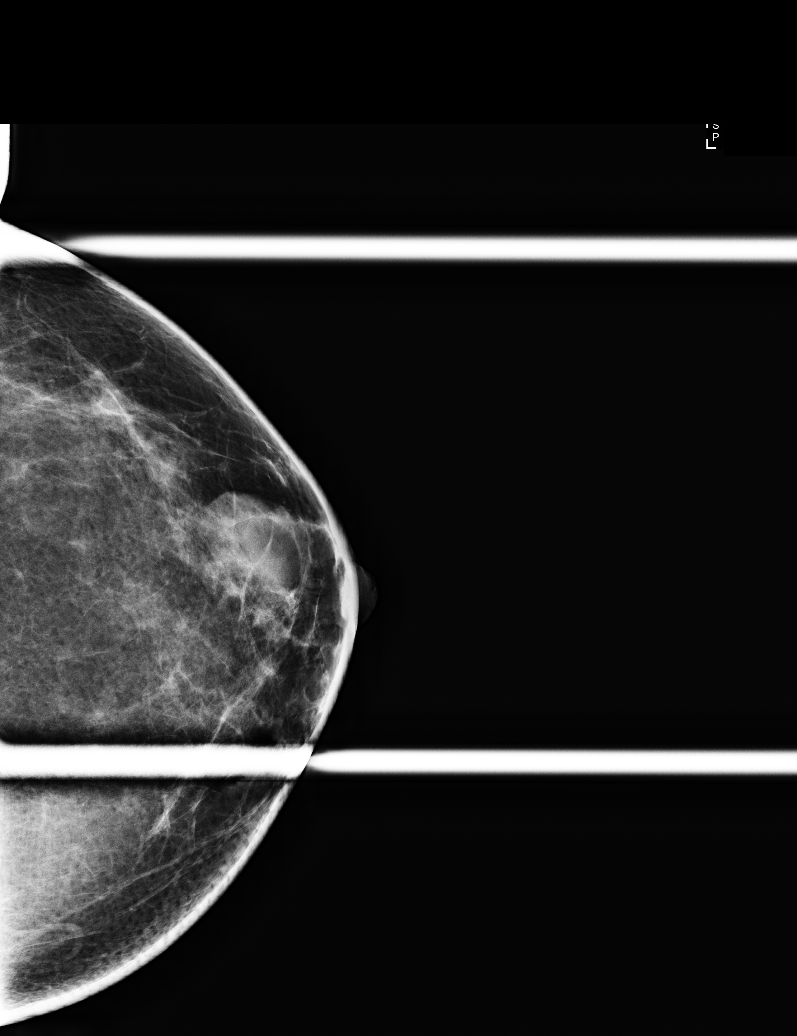

[L CC synth-2D]
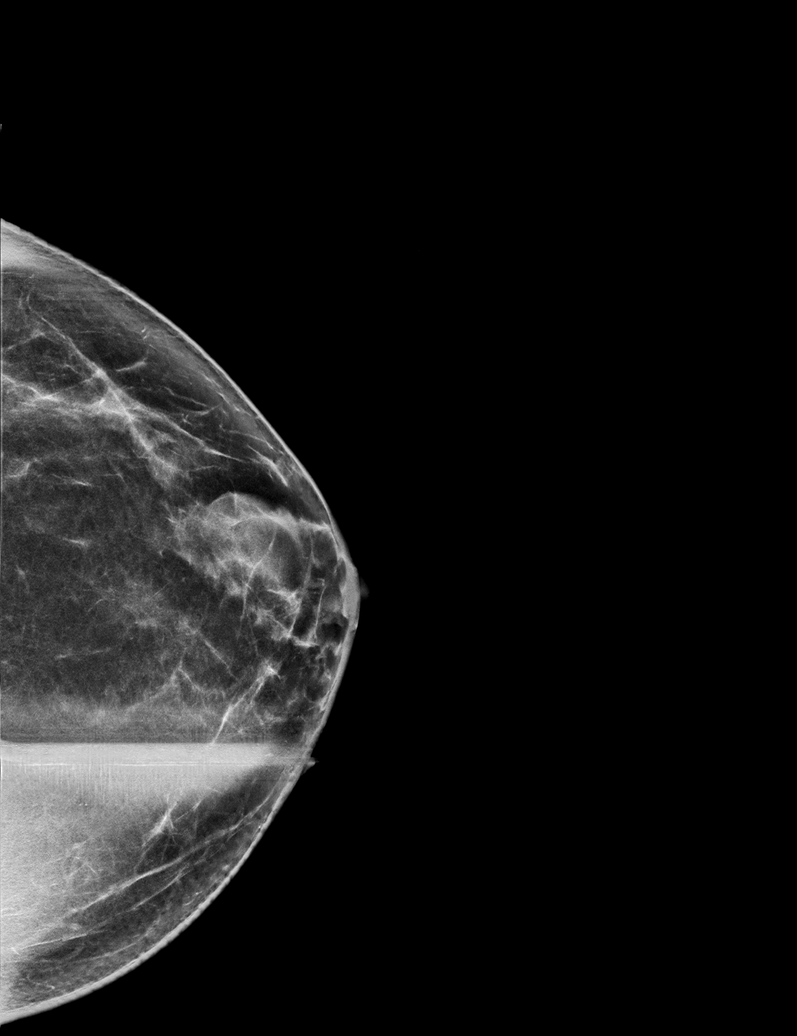

[L MLO]
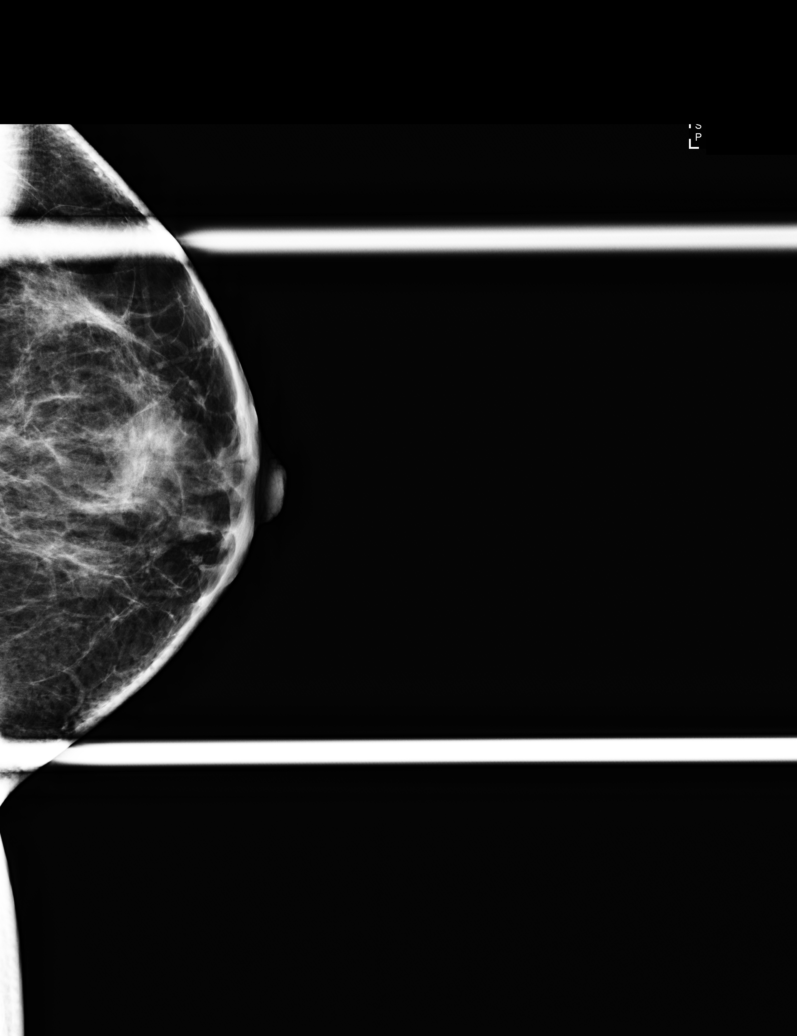

[L CC tomo · tomo slice 36/71.0]
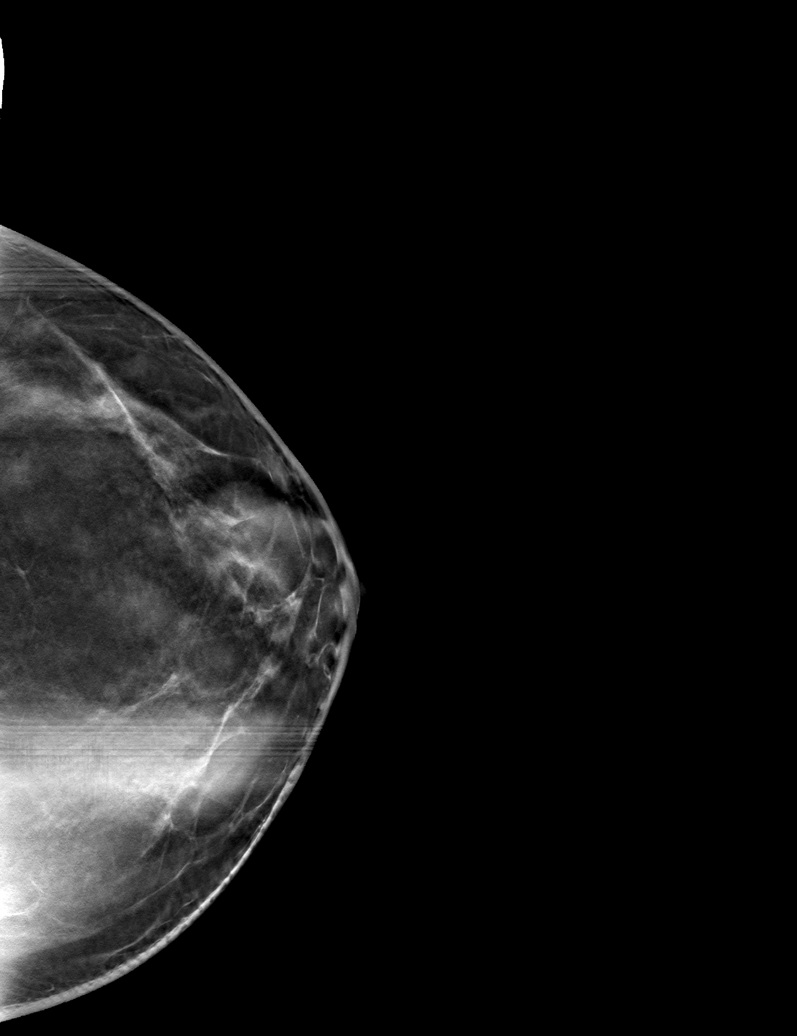

[L MLO tomo · tomo slice 35/70.0]
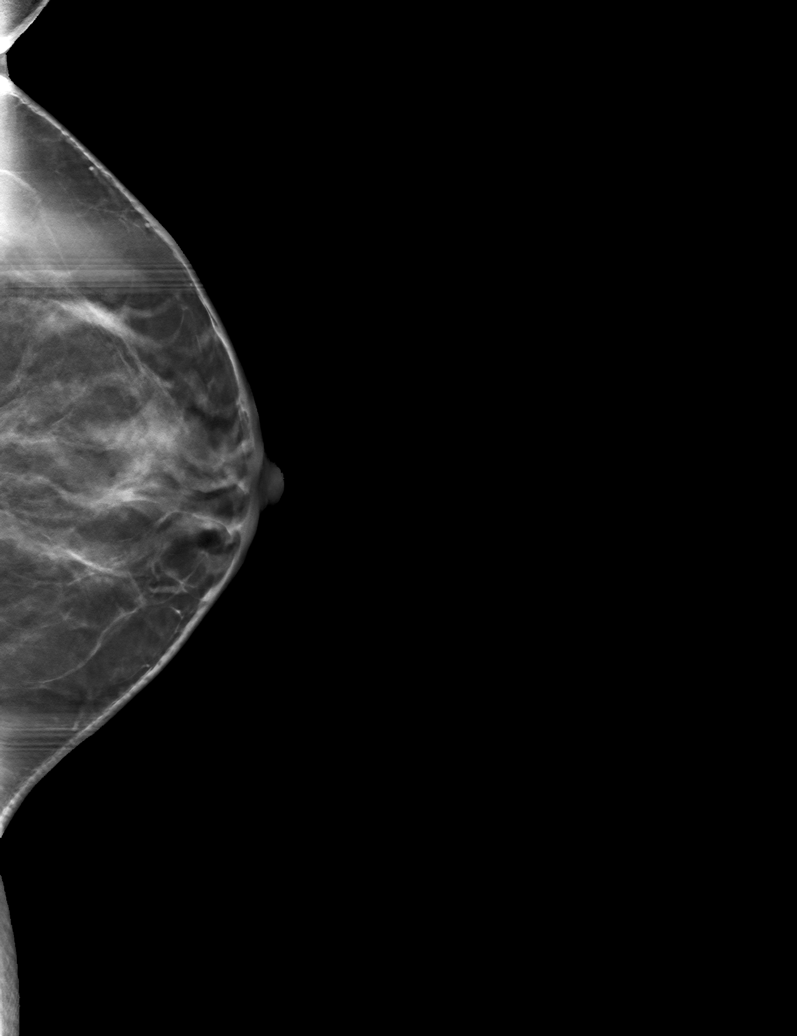

[6 of 14 positions shown; findings below may reference images not displayed]

ACR Breast Density Category b: There are scattered areas of
fibroglandular density.
FINDINGS: Mammographically, there is a persistent isodense to breast
parenchyma circumscribed mass in the subareolar left breast.

Mammographic images were processed with CAD.

On physical exam, round mobile mass is felt in the lateral
subareolar breast.

Targeted ultrasound is performed, showing benign-appearing cyst in
the left 4 o'clock subareolar breast which measures 2.9 x 0.9 x
cm. No suspicious masses are seen. This finding corresponds to the
mammographically seen benign-appearing mass.
IMPRESSION: Left subareolar breast benign-appearing cyst.

RECOMMENDATION:
Screening mammogram in one year.(Code:3F-9-4FY)

I have discussed the findings and recommendations with the patient.
Results were also provided in writing at the conclusion of the
visit. If applicable, a reminder letter will be sent to the patient
regarding the next appointment.

BI-RADS CATEGORY  2: Benign.

## 2021-07-29 ENCOUNTER — Ambulatory Visit
Admission: RE | Admit: 2021-07-29 | Discharge: 2021-07-29 | Disposition: A | Discharge status: Home / Self Care | Payer: No Typology Code available for payment source | Source: Ambulatory Visit | Attending: Emergency Medicine | Admitting: Emergency Medicine

## 2021-07-29 DIAGNOSIS — M545 Low back pain, unspecified: Secondary | ICD-10-CM | POA: Insufficient documentation

## 2021-07-29 DIAGNOSIS — R300 Dysuria: Secondary | ICD-10-CM | POA: Insufficient documentation

## 2021-07-29 LAB — POCT URINALYSIS DIP (MANUAL ENTRY)
Bilirubin, UA: NEGATIVE
Glucose, UA: NEGATIVE mg/dL
Ketones, POC UA: NEGATIVE mg/dL
Nitrite, UA: NEGATIVE
Protein Ur, POC: 30 mg/dL — AB
Spec Grav, UA: 1.01 (ref 1.010–1.025)
Urobilinogen, UA: 0.2 E.U./dL
pH, UA: 6.5 (ref 5.0–8.0)

## 2021-07-29 MED ORDER — CEPHALEXIN 500 MG PO CAPS: 500.0000 mg | ORAL_CAPSULE | Freq: Two times a day (BID) | ORAL | 0 refills | Status: AC

## 2021-07-29 NOTE — ED Provider Notes (Signed)
?[facility] ? ? ? ?CSN: [id number] ?Arrival date & time: 07/29/21  1313 ? ? ?  ? ?History   ?Chief Complaint ?Chief Complaint  ?Patient presents with  ? Back Pain  ?  Possible urinary or kidney infection - Entered by patient  ? ? ?HPI ?[name] is a 51 y.o. female.  Patient presents with dysuria and low back pain x1 day.  She is concerned for possible UTI.  She denies fever, chills, abdominal pain, nausea, vomiting, hematuria, vaginal discharge, pelvic pain, numbness, weakness, or other symptoms.  Treatment at home with OTC NSAID.   ? ?The history is provided by the patient and medical records.  ? ?Past Medical History:  ?Diagnosis Date  ? Depression   ? ? ?Patient Active Problem List  ? Diagnosis Date Noted  ? Viral URI with cough 12/31/2011  ? DEPRESSION/ANXIETY 11/27/2009  ? SKIN LESIONS, MULTIPLE 11/27/2009  ? TOBACCO ABUSE 03/14/2007  ? ? ?History reviewed. No pertinent surgical history. ? ?OB History   ?No obstetric history on file. ?  ? ? ? ?Home Medications   ? ?Prior to Admission medications   ?Medication Sig Start Date End Date Taking? Authorizing Provider  ?cephALEXin (KEFLEX) 500 MG capsule Take 1 capsule (500 mg total) by mouth 2 (two) times daily for 5 days. 07/29/21 08/03/21 Yes Mickie Bail, NP  ?buPROPion (WELLBUTRIN SR) 150 MG 12 hr tablet Take 1 tablet (150 mg total) by mouth 2 (two) times daily. 05/06/11 05/05/12  Hannah Beat, MD  ?dextromethorphan-guaiFENesin [facility] DM) 30-600 MG per 12 hr tablet Take 1 tablet by mouth every 12 (twelve) hours.    [provider]  ?fluticasone (FLONASE) 50 MCG/ACT nasal spray Place 2 sprays into the nose daily. 03/18/11 03/17/12  Copland, Karleen Hampshire, MD  ?ibuprofen (ADVIL,MOTRIN) 200 MG tablet Take 400 mg by mouth every 6 (six) hours as needed.    [provider]  ?loratadine-pseudoephedrine (CLARITIN-D 12-HOUR) 5-120 MG tablet Take 1 tablet by mouth 2 (two) times daily.      [provider]  ?oxybutynin (DITROPAN-XL) 10 MG  24 hr tablet oxybutynin chloride ER 10 mg tablet,extended release 24 hr ? TAKE 2 TABLETS BY MOUTH ONCE DAILY    [provider]  ?Solifenacin Succinate (VESICARE PO) Take 1 tablet by mouth daily.    [provider]  ? ? ?Family History ?Family History  ?Problem Relation Age of Onset  ? Rheum arthritis Mother   ? ? ?Social History ?Social History  ? ?Tobacco Use  ? Smoking status: Every Day  ?Substance Use Topics  ? Alcohol use: Yes  ? Drug use: No  ? ? ? ?Allergies   ?Patient has no known allergies. ? ? ?Review of Systems ?Review of Systems  ?Constitutional:  Negative for chills and fever.  ?Gastrointestinal:  Negative for abdominal pain, diarrhea, nausea and vomiting.  ?Genitourinary:  Positive for dysuria. Negative for hematuria, pelvic pain and vaginal discharge.  ?Musculoskeletal:  Positive for back pain. Negative for gait problem.  ?Skin:  Negative for color change and rash.  ?Neurological:  Negative for weakness and numbness.  ?All other systems reviewed and are negative. ? ? ?Physical Exam ?Triage Vital Signs ?ED Triage Vitals  ?Enc Vitals Group  ?   BP   ?   Pulse   ?   Resp   ?   Temp   ?   Temp src   ?   SpO2   ?   Weight   ?  Height   ?   Head Circumference   ?   Peak Flow   ?   Pain Score   ?   Pain Loc   ?   Pain Edu?   ?   Excl. in GC?   ? ?No data found. ? ?Updated Vital Signs ?There were no vitals taken for this visit. ? ?Visual Acuity ?Right Eye Distance:   ?Left Eye Distance:   ?Bilateral Distance:   ? ?Right Eye Near:   ?Left Eye Near:    ?Bilateral Near:    ? ?Physical Exam ?Vitals and nursing note reviewed.  ?Constitutional:   ?   General: She is not in acute distress. ?   Appearance: Normal appearance. She is well-developed. She is not ill-appearing.  ?HENT:  ?   Mouth/Throat:  ?   Mouth: Mucous membranes are moist.  ?Cardiovascular:  ?   Rate and Rhythm: Normal rate and regular rhythm.  ?   Heart sounds: Normal heart sounds.  ?Pulmonary:  ?   Effort: Pulmonary effort is  normal. No respiratory distress.  ?   Breath sounds: Normal breath sounds.  ?Abdominal:  ?   General: Bowel sounds are normal.  ?   Palpations: Abdomen is soft.  ?   Tenderness: There is no abdominal tenderness. There is no right CVA tenderness, left CVA tenderness, guarding or rebound.  ?Musculoskeletal:  ?   Cervical back: Neck supple.  ?Skin: ?   General: Skin is warm and dry.  ?Neurological:  ?   General: No focal deficit present.  ?   Mental Status: She is alert and oriented to person, place, and time.  ?   Gait: Gait normal.  ?Psychiatric:     ?   Mood and Affect: Mood normal.     ?   Behavior: Behavior normal.  ? ? ? ?UC Treatments / Results  ?Labs ?(all labs ordered are listed, but only abnormal results are displayed) ?Labs Reviewed  ?POCT URINALYSIS DIP (MANUAL ENTRY) - Abnormal; Notable for the following components:  ?    Result Value  ? Blood, UA moderate (*)   ? Protein Ur, POC =30 (*)   ? Leukocytes, UA Large (3+) (*)   ? All other components within normal limits  ?URINE CULTURE  ? ? ?EKG ? ? ?Radiology ?No results found. ? ?Procedures ?Procedures (including critical care time) ? ?Medications Ordered in UC ?Medications - No data to display ? ?Initial Impression / Assessment and Plan / UC Course  ?I have reviewed the triage vital signs and the nursing notes. ? ?Pertinent labs & imaging results that were available during my care of the patient were reviewed by me and considered in my medical decision making (see chart for details). ? ? Dysuria, Low back pain.  Treating with Keflex. Urine culture pending. Discussed with patient that we will call her if the urine culture shows the need to change or discontinue the antibiotic. Tylenol or ibuprofen as needed.  Instructed her to follow-up with her PCP if her symptoms are not improving. Patient agrees to plan of care.    ? ? ?Final Clinical Impressions(s) / UC Diagnoses  ? ?Final diagnoses:  ?Dysuria  ?Acute bilateral low back pain without sciatica   ? ? ? ?Discharge Instructions   ? ?  ?Take the antibiotic as directed.  The urine culture is pending.  We will call you if it shows the need to change or discontinue your antibiotic.   ? ?Follow up with  your primary care provider if your symptoms are not improving.   ? ? ? ? ? ? ?ED Prescriptions   ? ? Medication Sig Dispense Auth. Provider  ? cephALEXin (KEFLEX) 500 MG capsule Take 1 capsule (500 mg total) by mouth 2 (two) times daily for 5 days. 10 capsule Mickie Bailate, Camisha Srey H, NP  ? ?  ? ?PDMP not reviewed this encounter. ?  ?Mickie Bailate, Lavonda Thal H, NP ?07/29/21 1408 ? ?

## 2021-07-29 NOTE — ED Triage Notes (Signed)
Patient presents to Urgent Care with complaints of back pain x 1 day. Concerned with possible kidney infection or UTI.  ? ?Denies fever, N/V, and odor.  ?

## 2021-07-29 NOTE — Discharge Instructions (Addendum)
Take the antibiotic as directed.  The urine culture is pending.  We will call you if it shows the need to change or discontinue your antibiotic.    Follow up with your primary care provider if your symptoms are not improving.    

## 2021-08-01 LAB — URINE CULTURE: Culture: >=100000 — AB

## 2021-08-12 ENCOUNTER — Ambulatory Visit
Admission: RE | Admit: 2021-08-12 | Discharge: 2021-08-12 | Disposition: A | Discharge status: Home / Self Care | Payer: No Typology Code available for payment source | Source: Ambulatory Visit | Attending: Emergency Medicine | Admitting: Emergency Medicine

## 2021-08-12 VITALS — BP 142/87 | HR 76 | Temp 98.2°F | Resp 16

## 2021-08-12 DIAGNOSIS — R300 Dysuria: Secondary | ICD-10-CM

## 2021-08-12 DIAGNOSIS — N898 Other specified noninflammatory disorders of vagina: Secondary | ICD-10-CM

## 2021-08-12 LAB — POCT URINALYSIS DIP (MANUAL ENTRY)
Bilirubin, UA: NEGATIVE
Glucose, UA: NEGATIVE mg/dL
Ketones, POC UA: NEGATIVE mg/dL
Nitrite, UA: NEGATIVE
Protein Ur, POC: NEGATIVE mg/dL
Spec Grav, UA: 1.01 (ref 1.010–1.025)
Urobilinogen, UA: 0.2 E.U./dL
pH, UA: 5.5 (ref 5.0–8.0)

## 2021-08-12 MED ORDER — FLUCONAZOLE 150 MG PO TABS: ORAL_TABLET | ORAL | 0 refills | Status: DC

## 2021-08-12 MED ORDER — NITROFURANTOIN MONOHYD MACRO 100 MG PO CAPS: 100.0000 mg | ORAL_CAPSULE | Freq: Two times a day (BID) | ORAL | 0 refills | Status: DC

## 2021-08-12 NOTE — ED Triage Notes (Signed)
Patient presents to Urgent Care with complaints of possible UTI. She c/of dysuria and irritation since yesterday.

## 2021-08-12 NOTE — Discharge Instructions (Addendum)
Take the antibiotic as directed.  The urine culture is pending.  We will call you if it shows the need to change or discontinue your antibiotic.    Take the Diflucan as directed.  Your vaginal tests are pending.  If your test results are positive, we will call you.  Do not have sexual activity for at least 7 days.    Follow up with your primary care provider if your symptoms are not improving.

## 2021-08-12 NOTE — ED Provider Notes (Signed)
[facility]    CSN: [id number] Arrival date & time: 08/12/21  1834      History   Chief Complaint Chief Complaint  Patient presents with   Urinary Tract Infection    HPI [name] is a 51 y.o. female.  Patient presents with 1 day history of dysuria.  She also reports vaginal itching and irritation.  No vaginal discharge, abdominal pain, hematuria, flank pain, pelvic pain, fever, or other symptoms.  She was seen here on 07/29/2021; diagnosed with dysuria and low back pain; treated with cephalexin x5 days; urine culture grew >100,000 colonies of E. coli.  Her symptoms resolved at that time.    The history is provided by the patient and medical records.   Past Medical History:  Diagnosis Date   Depression     Patient Active Problem List   Diagnosis Date Noted   Viral URI with cough 12/31/2011   DEPRESSION/ANXIETY 11/27/2009   SKIN LESIONS, MULTIPLE 11/27/2009   TOBACCO ABUSE 03/14/2007    History reviewed. No pertinent surgical history.  OB History   No obstetric history on file.      Home Medications    Prior to Admission medications   Medication Sig Start Date End Date Taking? Authorizing Provider  buPROPion (WELLBUTRIN SR) 150 MG 12 hr tablet Take 1 tablet (150 mg total) by mouth 2 (two) times daily. 05/06/11 05/05/12  Copland, Karleen HampshireSpencer, MD  dextromethorphan-guaiFENesin [facility](MUCINEX DM) 30-600 MG per 12 hr tablet Take 1 tablet by mouth every 12 (twelve) hours.    [provider]  fluconazole (DIFLUCAN) 150 MG tablet Take one tablet as directed. 08/12/21  Yes Mickie Bailate, Stehanie Ekstrom H, NP  fluticasone (FLONASE) 50 MCG/ACT nasal spray Place 2 sprays into the nose daily. 03/18/11 03/17/12  Copland, Karleen HampshireSpencer, MD  ibuprofen (ADVIL,MOTRIN) 200 MG tablet Take 400 mg by mouth every 6 (six) hours as needed.    [provider]  loratadine-pseudoephedrine (CLARITIN-D 12-HOUR) 5-120 MG tablet Take 1 tablet by mouth 2 (two) times daily.      [provider]   nitrofurantoin, macrocrystal-monohydrate, (MACROBID) 100 MG capsule Take 1 capsule (100 mg total) by mouth 2 (two) times daily. 08/12/21  Yes Mickie Bailate, Jaquon Gingerich H, NP  oxybutynin (DITROPAN-XL) 10 MG 24 hr tablet oxybutynin chloride ER 10 mg tablet,extended release 24 hr  TAKE 2 TABLETS BY MOUTH ONCE DAILY    [provider]  Solifenacin Succinate (VESICARE PO) Take 1 tablet by mouth daily.    [provider]    Family History Family History  Problem Relation Age of Onset   Rheum arthritis Mother     Social History Social History   Tobacco Use   Smoking status: Every Day  Substance Use Topics   Alcohol use: Yes   Drug use: No     Allergies   Patient has no known allergies.   Review of Systems Review of Systems  Constitutional:  Negative for chills and fever.  Gastrointestinal:  Negative for abdominal pain.  Genitourinary:  Positive for dysuria. Negative for flank pain, hematuria, pelvic pain and vaginal discharge.       Vaginal itching and irritation  Skin:  Negative for color change and rash.  All other systems reviewed and are negative.   Physical Exam Triage Vital Signs ED Triage Vitals  Enc Vitals Group     BP      Pulse      Resp      Temp      Temp  src      SpO2      Weight      Height      Head Circumference      Peak Flow      Pain Score      Pain Loc      Pain Edu?      Excl. in GC?    No data found.  Updated Vital Signs BP (!) 142/87 (BP Location: Left Arm)   Pulse 76   Temp 98.2 F (36.8 C) (Temporal)   Resp 16   SpO2 97%   Visual Acuity Right Eye Distance:   Left Eye Distance:   Bilateral Distance:    Right Eye Near:   Left Eye Near:    Bilateral Near:     Physical Exam Vitals and nursing note reviewed.  Constitutional:      General: She is not in acute distress.    Appearance: Normal appearance. She is well-developed. She is not ill-appearing.  HENT:     Mouth/Throat:     Mouth: Mucous membranes are moist.   Cardiovascular:     Rate and Rhythm: Normal rate and regular rhythm.     Heart sounds: Normal heart sounds.  Pulmonary:     Effort: Pulmonary effort is normal. No respiratory distress.     Breath sounds: Normal breath sounds.  Abdominal:     Palpations: Abdomen is soft.     Tenderness: There is no abdominal tenderness. There is no right CVA tenderness, left CVA tenderness, guarding or rebound.  Musculoskeletal:     Cervical back: Neck supple.  Skin:    General: Skin is warm and dry.  Neurological:     Mental Status: She is alert.  Psychiatric:        Mood and Affect: Mood normal.        Behavior: Behavior normal.     UC Treatments / Results  Labs (all labs ordered are listed, but only abnormal results are displayed) Labs Reviewed  POCT URINALYSIS DIP (MANUAL ENTRY) - Abnormal; Notable for the following components:      Result Value   Blood, UA small (*)    Leukocytes, UA Small (1+) (*)    All other components within normal limits  URINE CULTURE  CERVICOVAGINAL ANCILLARY ONLY    EKG   Radiology No results found.  Procedures Procedures (including critical care time)  Medications Ordered in UC Medications - No data to display  Initial Impression / Assessment and Plan / UC Course  I have reviewed the triage vital signs and the nursing notes.  Pertinent labs & imaging results that were available during my care of the patient were reviewed by me and considered in my medical decision making (see chart for details).    Dysuria, vaginal irritation, vaginal itching. Patient was treated for UTI 2 weeks ago with cephalexin.  Treating today with Macrobid. Urine culture pending. Discussed with patient that we will call her if the urine culture shows the need to change or discontinue the antibiotic.  Patient obtained vaginal self swab for testing.  Treating with Diflucan.  Discussed that we will call if test results are positive.  Instructed patient to abstain from sexual  activity for at least 7 days.  Instructed her to follow-up with her PCP if her symptoms are not improving.  Patient agrees to plan of care.    Final Clinical Impressions(s) / UC Diagnoses   Final diagnoses:  Dysuria  Vaginal irritation  Vaginal itching  Discharge Instructions      Take the antibiotic as directed.  The urine culture is pending.  We will call you if it shows the need to change or discontinue your antibiotic.    Take the Diflucan as directed.  Your vaginal tests are pending.  If your test results are positive, we will call you.  Do not have sexual activity for at least 7 days.    Follow up with your primary care provider if your symptoms are not improving.          ED Prescriptions     Medication Sig Dispense Auth. Provider   fluconazole (DIFLUCAN) 150 MG tablet Take one tablet as directed. 1 tablet Mickie Bail, NP   nitrofurantoin, macrocrystal-monohydrate, (MACROBID) 100 MG capsule Take 1 capsule (100 mg total) by mouth 2 (two) times daily. 10 capsule Mickie Bail, NP      PDMP not reviewed this encounter.   Mickie Bail, NP 08/12/21 1901

## 2021-08-14 ENCOUNTER — Telehealth (HOSPITAL_COMMUNITY): Payer: Self-pay | Admitting: Emergency Medicine

## 2021-08-14 LAB — URINE CULTURE: Culture: NO GROWTH

## 2021-08-14 LAB — CERVICOVAGINAL ANCILLARY ONLY
Bacterial Vaginitis (gardnerella): POSITIVE — AB
Candida Glabrata: NEGATIVE
Candida Vaginitis: POSITIVE — AB
Chlamydia: NEGATIVE
Comment: NEGATIVE
Comment: NEGATIVE
Comment: NEGATIVE
Comment: NEGATIVE
Comment: NEGATIVE
Comment: NORMAL
Neisseria Gonorrhea: NEGATIVE
Trichomonas: NEGATIVE

## 2021-08-14 MED ORDER — METRONIDAZOLE 500 MG PO TABS: 500.0000 mg | ORAL_TABLET | Freq: Two times a day (BID) | ORAL | 0 refills | Status: DC

## 2021-08-14 MED ORDER — FLUCONAZOLE 150 MG PO TABS: 150.0000 mg | ORAL_TABLET | Freq: Once | ORAL | 0 refills | Status: AC

## 2021-11-16 ENCOUNTER — Ambulatory Visit
Admission: RE | Admit: 2021-11-16 | Discharge: 2021-11-16 | Disposition: A | Discharge status: Home / Self Care | Payer: No Typology Code available for payment source | Source: Ambulatory Visit | Attending: Physician Assistant | Admitting: Physician Assistant

## 2021-11-16 ENCOUNTER — Other Ambulatory Visit: Payer: Self-pay

## 2021-11-16 VITALS — BP 123/86 | HR 93 | Temp 98.4°F | Resp 18

## 2021-11-16 DIAGNOSIS — N3000 Acute cystitis without hematuria: Secondary | ICD-10-CM

## 2021-11-16 LAB — POCT URINALYSIS DIP (MANUAL ENTRY)
Bilirubin, UA: NEGATIVE
Glucose, UA: NEGATIVE mg/dL
Ketones, POC UA: NEGATIVE mg/dL
Nitrite, UA: NEGATIVE
Protein Ur, POC: NEGATIVE mg/dL
Spec Grav, UA: <=1.005 — AB (ref 1.010–1.025)
Urobilinogen, UA: 0.2 E.U./dL
pH, UA: 5.5 (ref 5.0–8.0)

## 2021-11-16 MED ORDER — FLUCONAZOLE 150 MG PO TABS: 150.0000 mg | ORAL_TABLET | Freq: Once | ORAL | 0 refills | Status: AC

## 2021-11-16 MED ORDER — NITROFURANTOIN MONOHYD MACRO 100 MG PO CAPS: 100.0000 mg | ORAL_CAPSULE | Freq: Two times a day (BID) | ORAL | 0 refills | Status: DC

## 2021-11-16 NOTE — ED Provider Notes (Signed)
[facility]    CSN: [id number] Arrival date & time: 11/16/21  0858      History   Chief Complaint Chief Complaint  Patient presents with   Back Pain    Entered by patient   Dysuria    HPI [name] is a 51 y.o. female.   Patient here today for evaluation of lower back pain and dysuria that has been ongoing the last 2 days. She reports symptoms have been similar with prior UTIs. She has not had fever. She denies any nausea, vomiting. She used a heating pad yesterday and tried to rest with some mild improvement.   The history is provided by the patient.  Back Pain Associated symptoms: dysuria   Associated symptoms: no abdominal pain, no chest pain and no fever   Dysuria Associated symptoms: no abdominal pain, no fever, no nausea and no vomiting     Past Medical History:  Diagnosis Date   Depression     Patient Active Problem List   Diagnosis Date Noted   Viral URI with cough 12/31/2011   DEPRESSION/ANXIETY 11/27/2009   SKIN LESIONS, MULTIPLE 11/27/2009   TOBACCO ABUSE 03/14/2007    History reviewed. No pertinent surgical history.  OB History   No obstetric history on file.      Home Medications    Prior to Admission medications   Medication Sig Start Date End Date Taking? Authorizing Provider  fluconazole (DIFLUCAN) 150 MG tablet Take 1 tablet (150 mg total) by mouth once for 1 dose. 11/16/21 11/16/21 Yes Tomi Bamberger, PA-C  nitrofurantoin, macrocrystal-monohydrate, (MACROBID) 100 MG capsule Take 1 capsule (100 mg total) by mouth 2 (two) times daily. 11/16/21  Yes Tomi Bamberger, PA-C  buPROPion [facility] SR) 150 MG 12 hr tablet Take 1 tablet (150 mg total) by mouth 2 (two) times daily. 05/06/11 05/05/12  Copland, Karleen Hampshire, MD  dextromethorphan-guaiFENesin [facility] DM) 30-600 MG per 12 hr tablet Take 1 tablet by mouth every 12 (twelve) hours.    [provider]  fluticasone (FLONASE) 50 MCG/ACT nasal spray Place 2 sprays into the  nose daily. 03/18/11 03/17/12  Copland, Karleen Hampshire, MD  ibuprofen (ADVIL,MOTRIN) 200 MG tablet Take 400 mg by mouth every 6 (six) hours as needed.    [provider]  loratadine-pseudoephedrine (CLARITIN-D 12-HOUR) 5-120 MG tablet Take 1 tablet by mouth 2 (two) times daily.      [provider]  metroNIDAZOLE (FLAGYL) 500 MG tablet Take 1 tablet (500 mg total) by mouth 2 (two) times daily. Patient not taking: Reported on 11/16/2021 08/14/21   Merrilee Jansky, MD  oxybutynin (DITROPAN-XL) 10 MG 24 hr tablet oxybutynin chloride ER 10 mg tablet,extended release 24 hr  TAKE 2 TABLETS BY MOUTH ONCE DAILY    [provider]  Solifenacin Succinate (VESICARE PO) Take 1 tablet by mouth daily.    [provider]    Family History Family History  Problem Relation Age of Onset   Rheum arthritis Mother     Social History Social History   Tobacco Use   Smoking status: Every Day  Substance Use Topics   Alcohol use: Yes   Drug use: No     Allergies   Patient has no known allergies.   Review of Systems Review of Systems  Constitutional:  Negative for chills and fever.  Respiratory:  Negative for shortness of breath.   Cardiovascular:  Negative for chest pain.  Gastrointestinal:  Negative for abdominal pain, nausea and vomiting.  Genitourinary:  Positive for dysuria and frequency.  Musculoskeletal:  Positive for back pain.     Physical Exam Triage Vital Signs ED Triage Vitals  Enc Vitals Group     BP 11/16/21 0911 123/86     Pulse Rate 11/16/21 0911 93     Resp 11/16/21 0911 18     Temp 11/16/21 0911 98.4 F (36.9 C)     Temp Source 11/16/21 0911 Oral     SpO2 11/16/21 0911 95 %     Weight --      Height --      Head Circumference --      Peak Flow --      Pain Score 11/16/21 0912 5     Pain Loc --      Pain Edu? --      Excl. in GC? --    No data found.  Updated Vital Signs BP 123/86 (BP Location: Left Arm)   Pulse 93   Temp 98.4 F (36.9  C) (Oral)   Resp 18   SpO2 95%      Physical Exam Vitals and nursing note reviewed.  Constitutional:      General: She is not in acute distress.    Appearance: Normal appearance. She is not ill-appearing.  HENT:     Head: Normocephalic and atraumatic.     Nose: Nose normal.  Cardiovascular:     Rate and Rhythm: Normal rate and regular rhythm.     Heart sounds: Normal heart sounds. No murmur heard. Pulmonary:     Effort: Pulmonary effort is normal. No respiratory distress.     Breath sounds: Normal breath sounds. No wheezing, rhonchi or rales.  Skin:    General: Skin is warm and dry.  Neurological:     Mental Status: She is alert.  Psychiatric:        Mood and Affect: Mood normal.        Thought Content: Thought content normal.      UC Treatments / Results  Labs (all labs ordered are listed, but only abnormal results are displayed) Labs Reviewed  POCT URINALYSIS DIP (MANUAL ENTRY) - Abnormal; Notable for the following components:      Result Value   Color, UA light yellow (*)    Spec Grav, UA <=1.005 (*)    Blood, UA trace-intact (*)    Leukocytes, UA Trace (*)    All other components within normal limits  URINE CULTURE    EKG   Radiology No results found.  Procedures Procedures (including critical care time)  Medications Ordered in UC Medications - No data to display  Initial Impression / Assessment and Plan / UC Course  I have reviewed the triage vital signs and the nursing notes.  Pertinent labs & imaging results that were available during my care of the patient were reviewed by me and considered in my medical decision making (see chart for details).    Macrobid prescribed to cover UTI, urine culture ordered. Diflucan also prescribed in the event patient develops yeast vaginitis after taking antibiotics. Encouraged follow up if no improvement or with any further concerns.   Final Clinical Impressions(s) / UC Diagnoses   Final diagnoses:  Acute  cystitis without hematuria   Discharge Instructions   None    ED Prescriptions     Medication Sig Dispense Auth. Provider   nitrofurantoin, macrocrystal-monohydrate, (MACROBID) 100 MG capsule Take 1 capsule (100 mg total) by mouth 2 (two) times daily. 10 capsule Izola Price,  Armandina Stammer, PA-C   fluconazole (DIFLUCAN) 150 MG tablet Take 1 tablet (150 mg total) by mouth once for 1 dose. 1 tablet Tomi Bamberger, PA-C      PDMP not reviewed this encounter.   Tomi Bamberger, PA-C 11/16/21 [phone]

## 2021-11-16 NOTE — ED Triage Notes (Signed)
Pt sts lower back pain with dysuria x 2 weeks worse over last few days

## 2021-11-17 LAB — URINE CULTURE: Culture: <10000 — AB

## 2022-06-07 ENCOUNTER — Ambulatory Visit
Admission: RE | Admit: 2022-06-07 | Discharge: 2022-06-07 | Disposition: A | Discharge status: Home / Self Care | Payer: 59 | Source: Ambulatory Visit | Attending: Internal Medicine | Admitting: Internal Medicine

## 2022-06-07 VITALS — BP 118/77 | HR 87 | Temp 98.2°F | Resp 16

## 2022-06-07 DIAGNOSIS — R220 Localized swelling, mass and lump, head: Secondary | ICD-10-CM

## 2022-06-07 MED ORDER — METHYLPREDNISOLONE ACETATE 80 MG/ML IJ SUSP
80.0000 mg | Freq: Once | INTRAMUSCULAR | Status: AC
  Administered 2022-06-07: 80 mg via INTRAMUSCULAR

## 2022-06-07 MED ORDER — AMOXICILLIN-POT CLAVULANATE 875-125 MG PO TABS: 1.0000 | ORAL_TABLET | Freq: Two times a day (BID) | ORAL | 0 refills | Status: DC

## 2022-06-07 NOTE — ED Triage Notes (Signed)
Pt c/o right infraorbital erythema and edema onset ~ Friday morning states usually it happens bilaterally w/ allergies. Reports pressure but denies pain

## 2022-06-07 NOTE — ED Triage Notes (Signed)
Called pt in waiting area without response

## 2022-06-07 NOTE — Discharge Instructions (Signed)
Antibiotic has been prescribed to help treat infection.  You were also given a steroid injection today in urgent care to help alleviate inflammation and possible allergic component.  Please monitor closely and follow-up if any symptoms persist or worsen.

## 2022-06-07 NOTE — ED Provider Notes (Signed)
[facility]    CSN: [id number] Arrival date & time: 06/07/22  1251      History   Chief Complaint Chief Complaint  Patient presents with   Eye Problem    Face extremely swollen under right eye - Entered by patient    HPI IDALEE SCHRAY is a 52 y.o. female.   Patient presents with right-sided facial swelling underneath right eye that started a few days prior.  Patient reports it is painful to touch and is not itchy.  She denies feelings of throat closing or shortness of breath.  Denies any associated nasal congestion or runny nose.  Denies fever.  Denies trauma to the face.  Patient reports that this has occurred before due to allergies but it is typically bilateral.  She states it is slightly different location as well. Denies blurry vision.    Eye Problem   Past Medical History:  Diagnosis Date   Depression     Patient Active Problem List   Diagnosis Date Noted   Viral URI with cough 12/31/2011   DEPRESSION/ANXIETY 11/27/2009   SKIN LESIONS, MULTIPLE 11/27/2009   TOBACCO ABUSE 03/14/2007    History reviewed. No pertinent surgical history.  OB History   No obstetric history on file.      Home Medications    Prior to Admission medications   Medication Sig Start Date End Date Taking? Authorizing Provider  amoxicillin-clavulanate (AUGMENTIN) 875-125 MG tablet Take 1 tablet by mouth every 12 (twelve) hours. 06/07/22  Yes Zayde Stroupe, Hildred Alamin E, FNP  buPROPion (WELLBUTRIN SR) 150 MG 12 hr tablet Take 1 tablet (150 mg total) by mouth 2 (two) times daily. 05/06/11 05/05/12  Copland, Frederico Hamman, MD  dextromethorphan-guaiFENesin [facility] DM) 30-600 MG per 12 hr tablet Take 1 tablet by mouth every 12 (twelve) hours.    [provider]  fluticasone (FLONASE) 50 MCG/ACT nasal spray Place 2 sprays into the nose daily. 03/18/11 03/17/12  Copland, Frederico Hamman, MD  ibuprofen (ADVIL,MOTRIN) 200 MG tablet Take 400 mg by mouth every 6 (six) hours as needed.    [provider]  loratadine-pseudoephedrine (CLARITIN-D 12-HOUR) 5-120 MG tablet Take 1 tablet by mouth 2 (two) times daily.      [provider]  oxybutynin (DITROPAN-XL) 10 MG 24 hr tablet oxybutynin chloride ER 10 mg tablet,extended release 24 hr  TAKE 2 TABLETS BY MOUTH ONCE DAILY    [provider]  Solifenacin Succinate (VESICARE PO) Take 1 tablet by mouth daily.    [provider]    Family History Family History  Problem Relation Age of Onset   Rheum arthritis Mother     Social History Social History   Tobacco Use   Smoking status: Every Day  Substance Use Topics   Alcohol use: Yes   Drug use: No     Allergies   Patient has no known allergies.   Review of Systems Review of Systems Per HPI  Physical Exam Triage Vital Signs ED Triage Vitals [06/07/22 1416]  Enc Vitals Group     BP 118/77     Pulse Rate 87     Resp 16     Temp 98.2 F (36.8 C)     Temp Source Oral     SpO2 96 %     Weight      Height      Head Circumference      Peak Flow      Pain Score 0     Pain Loc  Pain Edu?      Excl. in [place]?    No data found.  Updated Vital Signs BP 118/77 (BP Location: Right Arm)   Pulse 87   Temp 98.2 F (36.8 C) (Oral)   Resp 16   SpO2 96%   Visual Acuity Right Eye Distance:   Left Eye Distance:   Bilateral Distance:    Right Eye Near:   Left Eye Near:    Bilateral Near:     Physical Exam Constitutional:      General: She is not in acute distress.    Appearance: Normal appearance. She is not toxic-appearing or diaphoretic.  HENT:     Head: Normocephalic and atraumatic.      Comments: Patient has area of redness and mild swelling present to right upper face directly below right eye.  Eyelids appear normal.  Eyeball is normal.  Mildly tenderness to palpation to that area and directly below area.  Nose appears normal.    Nose: Nose normal.  Eyes:     Extraocular Movements: Extraocular movements intact.      Conjunctiva/sclera: Conjunctivae normal.  Pulmonary:     Effort: Pulmonary effort is normal.  Neurological:     General: No focal deficit present.     Mental Status: She is alert and oriented to person, place, and time. Mental status is at baseline.  Psychiatric:        Mood and Affect: Mood normal.        Behavior: Behavior normal.        Thought Content: Thought content normal.        Judgment: Judgment normal.      UC Treatments / Results  Labs (all labs ordered are listed, but only abnormal results are displayed) Labs Reviewed - No data to display  EKG   Radiology No results found.  Procedures Procedures (including critical care time)  Medications Ordered in UC Medications  methylPREDNISolone acetate (DEPO-MEDROL) injection 80 mg (80 mg Intramuscular Given 06/07/22 1440)    Initial Impression / Assessment and Plan / UC Course  I have reviewed the triage vital signs and the nursing notes.  Pertinent labs & imaging results that were available during my care of the patient were reviewed by me and considered in my medical decision making (see chart for details).     I am unsure exact etiology of patient's symptoms.  Differential diagnoses include facial cellulitis versus allergic reaction versus sinus inflammation.  Will cover for several different components today with Augmentin antibiotic and steroid injection to decrease inflammation.  Patient has taken steroids before and tolerated well. Patient was advised to monitor closely for any increased redness or swelling and to follow-up if this occurs.  Given no fever or obvious neurological complaints, do not think that emergent evaluation is necessary.  Patient also advised that antihistamine may be beneficial.  Patient verbalized understanding and was agreeable with plan. Final Clinical Impressions(s) / UC Diagnoses   Final diagnoses:  Facial swelling     Discharge Instructions      Antibiotic has been prescribed to  help treat infection.  You were also given a steroid injection today in urgent care to help alleviate inflammation and possible allergic component.  Please monitor closely and follow-up if any symptoms persist or worsen.    ED Prescriptions     Medication Sig Dispense Auth. Provider   amoxicillin-clavulanate (AUGMENTIN) 875-125 MG tablet Take 1 tablet by mouth every 12 (twelve) hours. 14 tablet [place], Michele Rockers, [place]  PDMP not reviewed this encounter.   Teodora Medici, [place] 06/07/22 1444

## 2022-12-30 ENCOUNTER — Encounter: Payer: Self-pay | Admitting: Family Medicine

## 2022-12-30 ENCOUNTER — Ambulatory Visit: Payer: 59 | Admitting: Family Medicine

## 2022-12-30 VITALS — BP 118/78 | HR 89 | Temp 98.2°F | Ht 68.0 in | Wt 175.0 lb

## 2022-12-30 DIAGNOSIS — T63481A Toxic effect of venom of other arthropod, accidental (unintentional), initial encounter: Secondary | ICD-10-CM | POA: Diagnosis not present

## 2022-12-30 DIAGNOSIS — R590 Localized enlarged lymph nodes: Secondary | ICD-10-CM | POA: Diagnosis not present

## 2022-12-30 MED ORDER — TRIAMCINOLONE ACETONIDE 0.1 % EX CREA: 1.0000 | TOPICAL_CREAM | Freq: Two times a day (BID) | CUTANEOUS | 0 refills | Status: DC

## 2022-12-30 NOTE — Patient Instructions (Signed)
Apply topical  steroid cream to bite.  Start Zyrtec or Xyzal at bedtime .  Call if redness or pain starting, or Shortness of breath.

## 2022-12-30 NOTE — Progress Notes (Signed)
Patient ID: [name], female    DOB: 1970-05-22, 52 y.o.   MRN: [id number]  This visit was conducted in person.  BP 118/78 (BP Location: Left Arm, Patient Position: Sitting, Cuff Size: Normal)   Pulse 89   Temp 98.2 F (36.8 C) (Oral)   Ht 5\' 8"  (1.727 m)   Wt 175 lb (79.4 kg)   SpO2 99%   BMI 26.61 kg/m    CC:  Chief Complaint  Patient presents with   Bite    On neck since Friday. Patient has a bump that has formed since Sunday     Subjective:   HPI: [name] is a 52 y.o. female presenting on 12/30/2022 for Bite (On neck since Friday. Patient has a bump that has formed since Sunday )    5 days ago stung by something on right posterior neck... itchy.  Since then she has noted a lump appear above the bite 2 days alater.    No fever, no flu like symptoms.  No other rash. No oral swelling, no SOB.    Getting B12 and testosterone for fatigue.    Relevant past medical, surgical, family and social history reviewed and updated as indicated. Interim medical history since our last visit reviewed. Allergies and medications reviewed and updated. Outpatient Medications Prior to Visit  Medication Sig Dispense Refill   buPROPion (WELLBUTRIN SR) 150 MG 12 hr tablet Take 1 tablet (150 mg total) by mouth 2 (two) times daily. 60 tablet 3   fluticasone (FLONASE) 50 MCG/ACT nasal spray Place 2 sprays into the nose daily. 16 g 12   ibuprofen (ADVIL,MOTRIN) 200 MG tablet Take 400 mg by mouth every 6 (six) hours as needed.     levothyroxine (SYNTHROID) 112 MCG tablet Take 112 mcg by mouth daily.     loratadine-pseudoephedrine (CLARITIN-D 12-HOUR) 5-120 MG tablet Take 1 tablet by mouth 2 (two) times daily.       oxybutynin (DITROPAN-XL) 10 MG 24 hr tablet oxybutynin chloride ER 10 mg tablet,extended release 24 hr  TAKE 2 TABLETS BY MOUTH ONCE DAILY     Solifenacin Succinate (VESICARE PO) Take 1 tablet by mouth daily.     amoxicillin-clavulanate (AUGMENTIN) 875-125 MG tablet  Take 1 tablet by mouth every 12 (twelve) hours. 14 tablet 0   dextromethorphan-guaiFENesin (MUCINEX DM) 30-600 MG per 12 hr tablet Take 1 tablet by mouth every 12 (twelve) hours.     No facility-administered medications prior to visit.     Per HPI unless specifically indicated in ROS section below Review of Systems  Constitutional:  Negative for fatigue and fever.  HENT:  Negative for congestion.   Eyes:  Negative for pain.  Respiratory:  Negative for cough and shortness of breath.   Cardiovascular:  Negative for chest pain, palpitations and leg swelling.  Gastrointestinal:  Negative for abdominal pain.  Genitourinary:  Negative for dysuria and vaginal bleeding.  Musculoskeletal:  Negative for back pain.  Neurological:  Negative for syncope, light-headedness and headaches.  Psychiatric/Behavioral:  Negative for dysphoric mood.    Objective:  BP 118/78 (BP Location: Left Arm, Patient Position: Sitting, Cuff Size: Normal)   Pulse 89   Temp 98.2 F (36.8 C) (Oral)   Ht 5\' 8"  (1.727 m)   Wt 175 lb (79.4 kg)   SpO2 99%   BMI 26.61 kg/m   Wt Readings from Last 3 Encounters:  12/30/22 175 lb (79.4 kg)  12/31/11 173 lb (78.5 kg)  05/06/11 169 lb  6.4 oz (76.8 kg)      Physical Exam Constitutional:      General: She is not in acute distress.    Appearance: Normal appearance. She is well-developed. She is not ill-appearing, toxic-appearing or diaphoretic.  HENT:     Head: Normocephalic.     Right Ear: Hearing, tympanic membrane, ear canal and external ear normal. Tympanic membrane is not erythematous, retracted or bulging.     Left Ear: Hearing, tympanic membrane, ear canal and external ear normal. Tympanic membrane is not erythematous, retracted or bulging.     Nose: No mucosal edema or rhinorrhea.     Right Sinus: No maxillary sinus tenderness or frontal sinus tenderness.     Left Sinus: No maxillary sinus tenderness or frontal sinus tenderness.     Mouth/Throat:     Mouth:  Oropharynx is clear and moist and mucous membranes are normal.     Pharynx: Uvula midline.  Eyes:     General: Lids are normal. Lids are everted, no foreign bodies appreciated.     Extraocular Movements: EOM normal.     Conjunctiva/sclera: Conjunctivae normal.     Pupils: Pupils are equal, round, and reactive to light.  Neck:     Thyroid: No thyroid mass or thyromegaly.     Vascular: No carotid bruit.     Trachea: Trachea normal.  Cardiovascular:     Rate and Rhythm: Normal rate and regular rhythm.     Pulses: Normal pulses.     Heart sounds: Normal heart sounds, S1 normal and S2 normal. No murmur heard.    No friction rub. No gallop.  Pulmonary:     Effort: Pulmonary effort is normal. No tachypnea or respiratory distress.     Breath sounds: Normal breath sounds. No decreased breath sounds, wheezing, rhonchi or rales.  Abdominal:     General: Bowel sounds are normal.     Palpations: Abdomen is soft.     Tenderness: There is no abdominal tenderness.  Musculoskeletal:     Cervical back: Normal range of motion and neck supple.  Lymphadenopathy:     Head:     Right side of head: Occipital adenopathy present. No submental, submandibular, tonsillar, preauricular or posterior auricular adenopathy.     Left side of head: No submental, submandibular, tonsillar, preauricular, posterior auricular or occipital adenopathy.     Cervical: No cervical adenopathy.     Upper Body:     Right upper body: No supraclavicular adenopathy.     Left upper body: No supraclavicular adenopathy.  Skin:    General: Skin is warm, dry and intact.     Findings: Rash present.     Comments: Small light pink macule around site of bite on right posterior neck, no pustule no fluctuation no  Neurological:     Mental Status: She is alert.  Psychiatric:        Mood and Affect: Mood is not anxious or depressed.        Speech: Speech normal.        Behavior: Behavior normal. Behavior is cooperative.        Thought  Content: Thought content normal.        Cognition and Memory: Cognition and memory normal.        Judgment: Judgment normal.       Results for orders placed or performed during the hospital encounter of 11/16/21  Urine Culture   Specimen: Urine, Clean Catch  Result Value Ref Range   Specimen Description  URINE, CLEAN CATCH    Special Requests NONE    Culture (A)     <10,000 COLONIES/mL INSIGNIFICANT GROWTH Performed at [facility], [address], [place], [place] [address]    Report Status 11/17/2021 FINAL   POCT urinalysis dipstick  Result Value Ref Range   Color, UA light yellow (A) yellow   Clarity, UA clear clear   Glucose, UA negative negative mg/dL   Bilirubin, UA negative negative   Ketones, POC UA negative negative mg/dL   Spec Grav, UA <=6.045 (A) 1.010 - 1.025   Blood, UA trace-intact (A) negative   pH, UA 5.5 5.0 - 8.0   Protein Ur, POC negative negative mg/dL   Urobilinogen, UA 0.2 0.2 or 1.0 E.U./dL   Nitrite, UA Negative Negative   Leukocytes, UA Trace (A) Negative    Assessment and Plan  Allergic reaction to insect sting, accidental or unintentional, initial encounter  Occipital lymphadenopathy  Other orders -     Triamcinolone Acetonide; Apply 1 Application topically 2 (two) times daily.  Dispense: 30 g; Refill: 0  No current sign of associated bacterial infection, lymphadenopathy likely due to allergic response to insect sting.  She can apply topical triamcinolone 0.1% twice daily to the bite.  She can start Zyrtec or Xyzal at bedtime.  Discussed how occipital lymph node will likely stay enlarged for several weeks to month. Return and ER precautions provided.  No current red flags or sign of anaphylaxis.  Signs and symptoms of bacterial infection reviewed with patient and she was instructed to contact me if these occur.  No follow-ups on file.   Kerby Nora, MD

## 2023-02-01 LAB — HM MAMMOGRAPHY

## 2023-02-02 ENCOUNTER — Encounter: Payer: Self-pay | Admitting: Family Medicine

## 2023-02-02 ENCOUNTER — Ambulatory Visit: Payer: 59 | Admitting: Family Medicine

## 2023-02-02 VITALS — BP 100/70 | HR 73 | Temp 98.7°F | Ht 68.0 in | Wt 166.5 lb

## 2023-02-02 DIAGNOSIS — H60332 Swimmer's ear, left ear: Secondary | ICD-10-CM | POA: Diagnosis not present

## 2023-02-02 DIAGNOSIS — H6993 Unspecified Eustachian tube disorder, bilateral: Secondary | ICD-10-CM | POA: Diagnosis not present

## 2023-02-02 MED ORDER — NEOMYCIN-POLYMYXIN-HC 3.5-10000-1 OT SOLN: 4.0000 [drp] | Freq: Four times a day (QID) | OTIC | 0 refills | Status: DC

## 2023-02-02 MED ORDER — PREDNISONE 10 MG PO TABS: ORAL_TABLET | ORAL | 0 refills | Status: DC

## 2023-02-02 NOTE — Assessment & Plan Note (Signed)
Acute, most likely bacterial left otitis externa.  Treat with antibiotic/steroid drops.  More affordable choice selected and sent to pharmacy.  Return and ER precautions provided

## 2023-02-02 NOTE — Assessment & Plan Note (Signed)
Acute, not improving with Flonase 2 sprays per nostril daily.  If her symptoms are not improving with time and continued Flonase as well as treatment of mixed internal ear infection on left, she will fill prescription for prednisone taper 10 mg. X 6 days

## 2023-02-02 NOTE — Progress Notes (Signed)
Patient ID: [name], female    DOB: [birth year], 52 y.o.   MRN: [id number]  This visit was conducted in person.  BP 100/70 (BP Location: Right Arm, Patient Position: Sitting, Cuff Size: Normal)   Pulse 73   Temp 98.7 F (37.1 C) (Temporal)   Ht 5\' 8"  (1.727 m)   Wt 166 lb 8 oz (75.5 kg)   SpO2 99%   BMI 25.32 kg/m    CC:  Chief Complaint  Patient presents with   Ear Pain    Seen at [facility] 01/22/23    Subjective:   HPI: [name] is a 52 y.o. female presenting on 02/02/2023 for Ear Pain (Seen at [facility] 01/22/23)  She presents for follow-up office visit: Seen at [facility] 9 days ago. Reviewed office visit note in detail.  Diagnosed with left infective otitis externa and allergies. Treated with Flonase 2 sprays per nostril daily and with Ciprodex otic - x 7 days  She did not  sue the drops as  it was too expensive... She thought they stated that it was not infectious.     No improvement or change.  Left ear pain, pressure, decreased hearing. No runny nose, no congestion, no cough, no fever.   Relevant past medical, surgical, family and social history reviewed and updated as indicated. Interim medical history since our last visit reviewed. Allergies and medications reviewed and updated. Outpatient Medications Prior to Visit  Medication Sig Dispense Refill   fluticasone (FLONASE) 50 MCG/ACT nasal spray Place 2 sprays into the nose daily. 16 g 12   ibuprofen (ADVIL,MOTRIN) 200 MG tablet Take 400 mg by mouth every 6 (six) hours as needed.     loratadine-pseudoephedrine (CLARITIN-D 12-HOUR) 5-120 MG tablet Take 1 tablet by mouth 2 (two) times daily.       oxybutynin (DITROPAN-XL) 10 MG 24 hr tablet oxybutynin chloride ER 10 mg tablet,extended release 24 hr  TAKE 2 TABLETS BY MOUTH ONCE DAILY     buPROPion (WELLBUTRIN SR) 150 MG 12 hr tablet Take 1 tablet (150 mg total) by mouth 2 (two) times daily. 60 tablet 3   levothyroxine  (SYNTHROID) 112 MCG tablet Take 112 mcg by mouth daily.     Solifenacin Succinate (VESICARE PO) Take 1 tablet by mouth daily.     triamcinolone cream (KENALOG) 0.1 % Apply 1 Application topically 2 (two) times daily. 30 g 0   No facility-administered medications prior to visit.     Per HPI unless specifically indicated in ROS section below Review of Systems  Constitutional:  Negative for fatigue and fever.  HENT:  Positive for ear pain.   Eyes:  Negative for pain.  Respiratory:  Negative for chest tightness and shortness of breath.   Cardiovascular:  Negative for chest pain, palpitations and leg swelling.  Gastrointestinal:  Negative for abdominal pain.  Genitourinary:  Negative for dysuria.   Objective:  BP 100/70 (BP Location: Right Arm, Patient Position: Sitting, Cuff Size: Normal)   Pulse 73   Temp 98.7 F (37.1 C) (Temporal)   Ht 5\' 8"  (1.727 m)   Wt 166 lb 8 oz (75.5 kg)   SpO2 99%   BMI 25.32 kg/m   Wt Readings from Last 3 Encounters:  02/02/23 166 lb 8 oz (75.5 kg)  12/30/22 175 lb (79.4 kg)  12/31/11 173 lb (78.5 kg)      Physical Exam Constitutional:      General: She is not in  acute distress.    Appearance: Normal appearance. She is well-developed. She is not ill-appearing or toxic-appearing.  HENT:     Head: Normocephalic.     Right Ear: Hearing, ear canal and external ear normal. A middle ear effusion is present. Tympanic membrane is not erythematous, retracted or bulging.     Left Ear: Hearing and ear canal normal. Tenderness present. A middle ear effusion is present. Tympanic membrane is not erythematous, retracted or bulging.     Ears:     Comments: Erythema and swelling in ear canal    Nose: No mucosal edema or rhinorrhea.     Right Sinus: No maxillary sinus tenderness or frontal sinus tenderness.     Left Sinus: No maxillary sinus tenderness or frontal sinus tenderness.     Mouth/Throat:     Mouth: Oropharynx is clear and moist and mucous membranes are  normal.     Pharynx: Uvula midline.  Eyes:     General: Lids are normal. Lids are everted, no foreign bodies appreciated.     Extraocular Movements: EOM normal.     Conjunctiva/sclera: Conjunctivae normal.     Pupils: Pupils are equal, round, and reactive to light.  Neck:     Thyroid: No thyroid mass or thyromegaly.     Vascular: No carotid bruit.     Trachea: Trachea normal.  Cardiovascular:     Rate and Rhythm: Normal rate and regular rhythm.     Pulses: Normal pulses.     Heart sounds: Normal heart sounds, S1 normal and S2 normal. No murmur heard.    No friction rub. No gallop.  Pulmonary:     Effort: Pulmonary effort is normal. No tachypnea or respiratory distress.     Breath sounds: Normal breath sounds. No decreased breath sounds, wheezing, rhonchi or rales.  Abdominal:     General: Bowel sounds are normal.     Palpations: Abdomen is soft.     Tenderness: There is no abdominal tenderness.  Musculoskeletal:     Cervical back: Normal range of motion and neck supple.  Skin:    General: Skin is warm, dry and intact.     Findings: No rash.  Neurological:     Mental Status: She is alert.  Psychiatric:        Mood and Affect: Mood is not anxious or depressed.        Speech: Speech normal.        Behavior: Behavior normal. Behavior is cooperative.        Thought Content: Thought content normal.        Cognition and Memory: Cognition and memory normal.        Judgment: Judgment normal.       Results for orders placed or performed during the hospital encounter of 11/16/21  Urine Culture   Specimen: Urine, Clean Catch  Result Value Ref Range   Specimen Description URINE, CLEAN CATCH    Special Requests NONE    Culture (A)     <10,000 COLONIES/mL INSIGNIFICANT GROWTH Performed at [facility], [address], [place], [place] [address]    Report Status 11/17/2021 FINAL   POCT urinalysis dipstick  Result Value Ref Range   Color, UA light yellow (A) yellow    Clarity, UA clear clear   Glucose, UA negative negative mg/dL   Bilirubin, UA negative negative   Ketones, POC UA negative negative mg/dL   Spec Grav, UA <=6.045 (A) 1.010 - 1.025   Blood, UA  trace-intact (A) negative   pH, UA 5.5 5.0 - 8.0   Protein Ur, POC negative negative mg/dL   Urobilinogen, UA 0.2 0.2 or 1.0 E.U./dL   Nitrite, UA Negative Negative   Leukocytes, UA Trace (A) Negative    Assessment and Plan  Acute swimmer's ear of left side Assessment & Plan: Acute, most likely bacterial left otitis externa.  Treat with antibiotic/steroid drops.  More affordable choice selected and sent to pharmacy.  Return and ER precautions provided   Dysfunction of both eustachian tubes Assessment & Plan: Acute, not improving with Flonase 2 sprays per nostril daily.  If her symptoms are not improving with time and continued Flonase as well as treatment of mixed internal ear infection on left, she will fill prescription for prednisone taper 10 mg. X 6 days   Other orders -     Neomycin-Polymyxin-HC; Place 4 drops into the left ear 4 (four) times daily.  Dispense: 10 mL; Refill: 0 -     predniSONE; 3 tabs by mouth daily x 3 days, then 2 tabs by mouth daily x 2 days then 1 tab by mouth daily x 2 days  Dispense: 15 tablet; Refill: 0    No follow-ups on file.   Kerby Nora, MD

## 2023-04-15 ENCOUNTER — Telehealth: Payer: Self-pay

## 2023-04-15 NOTE — Telephone Encounter (Signed)
Spoke to pt, scheduled cpe for 05/06/23

## 2023-04-15 NOTE — Telephone Encounter (Signed)
Please call patient due for physical

## 2023-04-19 ENCOUNTER — Telehealth: Payer: Self-pay | Admitting: *Deleted

## 2023-04-19 DIAGNOSIS — Z1159 Encounter for screening for other viral diseases: Secondary | ICD-10-CM

## 2023-04-19 DIAGNOSIS — Z114 Encounter for screening for human immunodeficiency virus [HIV]: Secondary | ICD-10-CM

## 2023-04-19 DIAGNOSIS — Z13220 Encounter for screening for lipoid disorders: Secondary | ICD-10-CM

## 2023-04-19 NOTE — Telephone Encounter (Signed)
-----   Message from Alvina Chou sent at 04/19/2023 10:52 AM EST ----- Regarding: lab orders for [facility], 2.13.25 Patient is scheduled for CPX labs, please order future labs, Thanks , Camelia Eng

## 2023-04-23 ENCOUNTER — Encounter: Payer: Self-pay | Admitting: Family Medicine

## 2023-04-29 ENCOUNTER — Encounter: Payer: Self-pay | Admitting: Family Medicine

## 2023-04-29 ENCOUNTER — Other Ambulatory Visit (INDEPENDENT_AMBULATORY_CARE_PROVIDER_SITE_OTHER): Payer: 59

## 2023-04-29 DIAGNOSIS — Z1159 Encounter for screening for other viral diseases: Secondary | ICD-10-CM

## 2023-04-29 DIAGNOSIS — Z114 Encounter for screening for human immunodeficiency virus [HIV]: Secondary | ICD-10-CM

## 2023-04-29 DIAGNOSIS — Z13220 Encounter for screening for lipoid disorders: Secondary | ICD-10-CM

## 2023-04-29 LAB — COMPREHENSIVE METABOLIC PANEL
ALT: 7 U/L (ref 0–35)
AST: 12 U/L (ref 0–37)
Albumin: 4.4 g/dL (ref 3.5–5.2)
Alkaline Phosphatase: 51 U/L (ref 39–117)
BUN: 10 mg/dL (ref 6–23)
CO2: 28 meq/L (ref 19–32)
Calcium: 9 mg/dL (ref 8.4–10.5)
Chloride: 103 meq/L (ref 96–112)
Creatinine, Ser: 0.81 mg/dL (ref 0.40–1.20)
GFR: 84.35 mL/min (ref >60.00)
Glucose, Bld: 80 mg/dL (ref 70–99)
Potassium: 4 meq/L (ref 3.5–5.1)
Sodium: 138 meq/L (ref 135–145)
Total Bilirubin: 0.5 mg/dL (ref 0.2–1.2)
Total Protein: 7 g/dL (ref 6.0–8.3)

## 2023-04-29 LAB — LIPID PANEL
Cholesterol: 180 mg/dL (ref 0–200)
HDL: 50.5 mg/dL (ref >39.00)
LDL Cholesterol: 113 mg/dL — ABNORMAL HIGH (ref 0–99)
NonHDL: 129.63
Total CHOL/HDL Ratio: 4
Triglycerides: 82 mg/dL (ref 0.0–149.0)
VLDL: 16.4 mg/dL (ref 0.0–40.0)

## 2023-04-29 NOTE — Progress Notes (Signed)
No critical labs need to be addressed urgently. We will discuss labs in detail at upcoming office visit.

## 2023-04-30 LAB — HIV ANTIBODY (ROUTINE TESTING W REFLEX): HIV 1&2 Ab, 4th Generation: NONREACTIVE

## 2023-04-30 LAB — HEPATITIS C ANTIBODY: Hepatitis C Ab: NONREACTIVE

## 2023-05-06 ENCOUNTER — Encounter: Payer: 59 | Admitting: Family Medicine

## 2023-05-06 ENCOUNTER — Ambulatory Visit (INDEPENDENT_AMBULATORY_CARE_PROVIDER_SITE_OTHER): Payer: 59 | Admitting: Family Medicine

## 2023-05-06 ENCOUNTER — Encounter: Payer: Self-pay | Admitting: *Deleted

## 2023-05-06 VITALS — BP 98/70 | HR 99 | Temp 98.7°F | Ht 66.5 in | Wt 161.4 lb

## 2023-05-06 DIAGNOSIS — Z1211 Encounter for screening for malignant neoplasm of colon: Secondary | ICD-10-CM | POA: Diagnosis not present

## 2023-05-06 DIAGNOSIS — Z720 Tobacco use: Secondary | ICD-10-CM | POA: Diagnosis not present

## 2023-05-06 DIAGNOSIS — Z85820 Personal history of malignant melanoma of skin: Secondary | ICD-10-CM | POA: Insufficient documentation

## 2023-05-06 DIAGNOSIS — Z0000 Encounter for general adult medical examination without abnormal findings: Secondary | ICD-10-CM | POA: Diagnosis not present

## 2023-05-06 NOTE — Progress Notes (Signed)
Patient ID: [name], female    DOB: 01-04-1971, 53 y.o.   MRN: [id number]  This visit was conducted in person.  BP 98/70 (BP Location: Left Arm, Patient Position: Sitting, Cuff Size: Normal)   Pulse 99   Temp 98.7 F (37.1 C) (Temporal)   Ht 5' 6.5" (1.689 m)   Wt 161 lb 6 oz (73.2 kg)   SpO2 99%   BMI 25.66 kg/m    CC:  Chief Complaint  Patient presents with   Annual Exam    Subjective:   HPI: [name] is a 53 y.o. female presenting on 05/06/2023 for Annual Exam  The patient presents for complete physical and review of chronic health problems. She also has the following acute concerns today: none  Patient has not had a physical in quite some time.  She is overdue for multiple preventative screening listed below. 4 Reviewed labs in detail with patient.  Lab Results  Component Value Date   CHOL 180 04/29/2023   HDL 50.50 04/29/2023   LDLCALC 113 (H) 04/29/2023   LDLDIRECT 148.8 05/06/2011   TRIG 82.0 04/29/2023   CHOLHDL 4 04/29/2023  The 10-year ASCVD risk score (Arnett DK, et al., 2019) is: 2%   Values used to calculate the score:     Age: 74 years     Sex: Female     Is Non-Hispanic African American: No     Diabetic: No     Tobacco smoker: Yes     Systolic Blood Pressure: 98 mmHg     Is BP treated: No     HDL Cholesterol: 50.5 mg/dL     Total Cholesterol: 180 mg/dL  Diet: heart healthy  Exercise: walking 3-4  times a week.  Current active tobacco use. Has decreased cigarettes over time .Marland Kitchen Now 2-3 per day.     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    05/06/2023   10:40 AM 12/30/2022    8:32 AM  Depression screen PHQ 2/9  Decreased Interest 0 1  Down, Depressed, Hopeless 0 1  PHQ - 2 Score 0 2  Altered sleeping  1  Tired, decreased energy  1  Change in appetite  1  Feeling bad or failure about yourself   1  Trouble concentrating  1  Moving slowly or fidgety/restless  0  Suicidal thoughts  0  PHQ-9 Score  7  Difficult doing work/chores   Somewhat difficult     Relevant past medical, surgical, family and social history reviewed and updated as indicated. Interim medical history since our last visit reviewed. Allergies and medications reviewed and updated. Outpatient Medications Prior to Visit  Medication Sig Dispense Refill   fluticasone (FLONASE) 50 MCG/ACT nasal spray Place 2 sprays into the nose daily. 16 g 12   levothyroxine (SYNTHROID) 112 MCG tablet Take 112 mcg by mouth every morning.     loratadine-pseudoephedrine (CLARITIN-D 12-HOUR) 5-120 MG tablet Take 1 tablet by mouth 2 (two) times daily.       trospium (SANCTURA) 20 MG tablet Take 20 mg by mouth 2 (two) times daily.     ibuprofen (ADVIL,MOTRIN) 200 MG tablet Take 400 mg by mouth every 6 (six) hours as needed.     neomycin-polymyxin-hydrocortisone (CORTISPORIN) OTIC solution Place 4 drops into the left ear 4 (four) times daily. 10 mL 0   oxybutynin (DITROPAN-XL) 10 MG 24 hr tablet oxybutynin chloride ER 10 mg tablet,extended release 24 hr  TAKE 2 TABLETS BY MOUTH ONCE DAILY  predniSONE (DELTASONE) 10 MG tablet 3 tabs by mouth daily x 3 days, then 2 tabs by mouth daily x 2 days then 1 tab by mouth daily x 2 days 15 tablet 0   No facility-administered medications prior to visit.     Per HPI unless specifically indicated in ROS section below Review of Systems  Constitutional:  Negative for fatigue and fever.  HENT:  Negative for congestion.   Eyes:  Negative for pain.  Respiratory:  Negative for cough and shortness of breath.   Cardiovascular:  Negative for chest pain, palpitations and leg swelling.  Gastrointestinal:  Negative for abdominal pain.  Genitourinary:  Negative for dysuria and vaginal bleeding.  Musculoskeletal:  Negative for back pain.  Neurological:  Negative for syncope, light-headedness and headaches.  Psychiatric/Behavioral:  Negative for dysphoric mood.     Objective:  BP 98/70 (BP Location: Left Arm, Patient Position: Sitting, Cuff  Size: Normal)   Pulse 99   Temp 98.7 F (37.1 C) (Temporal)   Ht 5' 6.5" (1.689 m)   Wt 161 lb 6 oz (73.2 kg)   SpO2 99%   BMI 25.66 kg/m   Wt Readings from Last 3 Encounters:  05/06/23 161 lb 6 oz (73.2 kg)  02/02/23 166 lb 8 oz (75.5 kg)  12/30/22 175 lb (79.4 kg)      Physical Exam Vitals and nursing note reviewed.  Constitutional:      General: She is not in acute distress.    Appearance: Normal appearance. She is well-developed. She is not ill-appearing or toxic-appearing.  HENT:     Head: Normocephalic.     Right Ear: Hearing, tympanic membrane, ear canal and external ear normal.     Left Ear: Hearing, tympanic membrane, ear canal and external ear normal.     Nose: Nose normal.  Eyes:     General: Lids are normal. Lids are everted, no foreign bodies appreciated.     Conjunctiva/sclera: Conjunctivae normal.     Pupils: Pupils are equal, round, and reactive to light.  Neck:     Thyroid: No thyroid mass or thyromegaly.     Vascular: No carotid bruit.     Trachea: Trachea normal.  Cardiovascular:     Rate and Rhythm: Normal rate and regular rhythm.     Heart sounds: Normal heart sounds, S1 normal and S2 normal. No murmur heard.    No gallop.  Pulmonary:     Effort: Pulmonary effort is normal. No respiratory distress.     Breath sounds: Normal breath sounds. No wheezing, rhonchi or rales.  Abdominal:     General: Bowel sounds are normal. There is no distension or abdominal bruit.     Palpations: Abdomen is soft. There is no fluid wave or mass.     Tenderness: There is no abdominal tenderness. There is no guarding or rebound.     Hernia: No hernia is present.  Musculoskeletal:     Cervical back: Normal range of motion and neck supple.  Lymphadenopathy:     Cervical: No cervical adenopathy.  Skin:    General: Skin is warm and dry.     Findings: No rash.  Neurological:     Mental Status: She is alert.     Cranial Nerves: No cranial nerve deficit.     Sensory: No  sensory deficit.  Psychiatric:        Mood and Affect: Mood is not anxious or depressed.        Speech: Speech normal.  Behavior: Behavior normal. Behavior is cooperative.        Judgment: Judgment normal.       Results for orders placed or performed in visit on 04/29/23  HIV Antibody (routine testing w rflx)   Collection Time: 04/29/23  8:35 AM  Result Value Ref Range   HIV 1&2 Ab, 4th Generation NON-REACTIVE NON-REACTIVE  Hepatitis C antibody   Collection Time: 04/29/23  8:35 AM  Result Value Ref Range   Hepatitis C Ab NON-REACTIVE NON-REACTIVE  Lipid panel   Collection Time: 04/29/23  8:35 AM  Result Value Ref Range   Cholesterol 180 0 - 200 mg/dL   Triglycerides 40.9 0.0 - 149.0 mg/dL   HDL 81.19 >14.78 mg/dL   VLDL 29.5 0.0 - 62.1 mg/dL   LDL Cholesterol 308 (H) 0 - 99 mg/dL   Total CHOL/HDL Ratio 4    NonHDL 129.63   Comprehensive metabolic panel   Collection Time: 04/29/23  8:35 AM  Result Value Ref Range   Sodium 138 135 - 145 mEq/L   Potassium 4.0 3.5 - 5.1 mEq/L   Chloride 103 96 - 112 mEq/L   CO2 28 19 - 32 mEq/L   Glucose, Bld 80 70 - 99 mg/dL   BUN 10 6 - 23 mg/dL   Creatinine, Ser 6.57 0.40 - 1.20 mg/dL   Total Bilirubin 0.5 0.2 - 1.2 mg/dL   Alkaline Phosphatase 51 39 - 117 U/L   AST 12 0 - 37 U/L   ALT 7 0 - 35 U/L   Total Protein 7.0 6.0 - 8.3 g/dL   Albumin 4.4 3.5 - 5.2 g/dL   GFR 84.69 >62.95 mL/min   Calcium 9.0 8.4 - 10.5 mg/dL    Assessment and Plan The patient's preventative maintenance and recommended screening tests for an annual wellness exam were reviewed in full today. Brought up to date unless services declined.  Counselled on the importance of diet, exercise, and its role in overall health and mortality. The patient's FH and SH was reviewed, including their home life, tobacco status, and drug and alcohol status.   Vaccines: Refused flu vaccine, Shingrix vaccine, tetanus vaccine Pap/DVE:   seen at [facility].Marland Kitchen  01/2023 Mammo:  01/2023 Bone Density: not indicated. Colon:  due  Lung cancer screening candidate... referral Smoking Status: continued ETOH/ drug use: occ/none   Hep C:  done  HIV screen:   done  Recommend yearly skin exam with dermatology given melanoma history.   Routine general medical examination at a health care facility  Tobacco abuse Assessment & Plan:  Smoking cessation instruction/counseling given:  counseled patient on the dangers of tobacco use, advised patient to stop smoking, and reviewed strategies to maximize success   Orders: -     Ambulatory Referral for Lung Cancer Scre  Colon cancer screening -     Ambulatory referral to Gastroenterology  History of melanoma     Return in about 1 year (around 05/05/2024) for annual physical with fasting labs prior.   Kerby Nora, MD

## 2023-05-06 NOTE — Assessment & Plan Note (Addendum)
 Smoking cessation instruction/counseling given:  counseled patient on the dangers of tobacco use, advised patient to stop smoking, and reviewed strategies to maximize success

## 2023-05-18 ENCOUNTER — Encounter: Payer: Self-pay | Admitting: *Deleted

## 2024-02-28 ENCOUNTER — Other Ambulatory Visit: Payer: Self-pay | Admitting: Obstetrics and Gynecology

## 2024-02-28 ENCOUNTER — Other Ambulatory Visit: Payer: Self-pay

## 2024-02-28 DIAGNOSIS — R928 Other abnormal and inconclusive findings on diagnostic imaging of breast: Secondary | ICD-10-CM

## 2024-03-06 ENCOUNTER — Inpatient Hospital Stay: Admission: RE | Admit: 2024-03-06 | Discharge: 2024-03-06

## 2024-03-06 ENCOUNTER — Other Ambulatory Visit: Payer: Self-pay

## 2024-03-06 DIAGNOSIS — R928 Other abnormal and inconclusive findings on diagnostic imaging of breast: Secondary | ICD-10-CM

## 2024-03-06 DIAGNOSIS — N6320 Unspecified lump in the left breast, unspecified quadrant: Secondary | ICD-10-CM

## 2024-03-14 ENCOUNTER — Ambulatory Visit
Admission: RE | Admit: 2024-03-14 | Discharge: 2024-03-14 | Disposition: A | Discharge status: Home / Self Care | Source: Ambulatory Visit

## 2024-03-14 DIAGNOSIS — N6320 Unspecified lump in the left breast, unspecified quadrant: Secondary | ICD-10-CM

## 2024-03-14 DIAGNOSIS — R928 Other abnormal and inconclusive findings on diagnostic imaging of breast: Secondary | ICD-10-CM

## 2024-03-14 HISTORY — PX: BREAST BIOPSY: SHX20

## 2024-03-15 LAB — SURGICAL PATHOLOGY

## 2024-03-24 ENCOUNTER — Other Ambulatory Visit: Payer: Self-pay | Admitting: Surgery

## 2024-03-24 DIAGNOSIS — Z853 Personal history of malignant neoplasm of breast: Secondary | ICD-10-CM

## 2024-03-30 ENCOUNTER — Other Ambulatory Visit: Payer: Self-pay | Admitting: Surgery

## 2024-03-30 DIAGNOSIS — Z853 Personal history of malignant neoplasm of breast: Secondary | ICD-10-CM

## 2024-03-31 ENCOUNTER — Inpatient Hospital Stay
Admission: RE | Admit: 2024-03-31 | Discharge: 2024-03-31 | Disposition: A | Discharge status: Home / Self Care | Payer: Self-pay | Source: Ambulatory Visit | Attending: Radiation Oncology | Admitting: Radiation Oncology

## 2024-03-31 ENCOUNTER — Other Ambulatory Visit: Payer: Self-pay | Admitting: Radiation Oncology

## 2024-03-31 DIAGNOSIS — C50912 Malignant neoplasm of unspecified site of left female breast: Secondary | ICD-10-CM

## 2024-03-31 NOTE — Progress Notes (Incomplete)
 Location of Breast Cancer:{:21944} ***  Histology per Pathology Report: ***  Receptor Status: ER(***), PR (***), Her2-neu (***), Ki-67(***)  Did patient present with symptoms (if so, please note symptoms) or was this found on screening mammography?: ***  Past/Anticipated interventions by surgeon, if any:{t:21944} ***  Past/Anticipated interventions by medical oncology, if any: Chemotherapy ***  Lymphedema issues, if any:  {:18581} {t:21944}   Pain issues, if any:  {:18581} {PAIN DESCRIPTION:21022940}  SAFETY ISSUES: Prior radiation? {:18581} Pacemaker/ICD? {:18581} Possible current pregnancy?{:18581} Is the patient on methotrexate? {:18581}  Current Complaints / other details:  ***

## 2024-04-01 DIAGNOSIS — C50812 Malignant neoplasm of overlapping sites of left female breast: Secondary | ICD-10-CM | POA: Insufficient documentation

## 2024-04-01 NOTE — Progress Notes (Signed)
 " Radiation Oncology         [phone] ________________________________  Name: [name]        MRN: [id number]  Date of Service: 04/03/2024 DOB: 01-13-1971  RR:Azidnoz, Greig BRAVO, MD  Vernetta Berg, MD     REFERRING PHYSICIAN: Vernetta Berg, MD   DIAGNOSIS: The encounter diagnosis was Malignant neoplasm of overlapping sites of left breast in female, estrogen receptor positive (HCC). R49.187    HISTORY OF PRESENT ILLNESS: [name] is a 54 y.o. female seen in consultation for radiation therapy.  She presented after screening mammogram in early December revealed a possible L breast asymmetry warranting further evaluation.   Subsequent diagnostic mammogram and US  of the L breast was obtained on 03/06/24. This revealed an irregularly shaped mass with spiculated margins which was persistent on spot compression. US  demonstrated an irregularly shaped mass with angular margins at 9 o'clock 2 CMFN measuring 0.8 x 0.7 x 0.8. No enlarged L axillary lymph nodes were seen.   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  She saw Dr. Vernetta on 03/25/23 who is planning breast conservation with lumpectomy and SLNB on 04/14/23. He made referrals to radiation oncology, medical oncology, genetics and physical therapy.   She presents today to discuss radiation preoperatively.   Estrogen exposure history:  Childbearing/breastfeeding:   Family history of cancer:  PREVIOUS RADIATION THERAPY: {EXAM; YES/NO:19492::No}  AUTOIMMUNE DISEASE: {EXAM; YES/NO:19492::No}  MEDICAL DEVICES: {EXAM; YES/NO:19492::No}  PREGNANCY: {EXAM; YES/NO:19492::No}   PAST MEDICAL HISTORY:  Past Medical History:  Diagnosis Date   Depression        PAST SURGICAL HISTORY: Past Surgical History:  Procedure Laterality Date   BREAST BIOPSY Left 03/14/2024   US  LT BREAST BX W LOC DEV 1ST LESION IMG BX SPEC US  GUIDE  03/14/2024 [facility] MAMMOGRAPHY     FAMILY HISTORY:  Family History  Problem Relation Age of Onset   Rheum arthritis Mother      SOCIAL HISTORY:  reports that she has been smoking. She does not have any smokeless tobacco history on file. She reports current alcohol use. She reports that she does not use drugs.   ALLERGIES: Patient has no known allergies.   MEDICATIONS:  Current Outpatient Medications  Medication Sig Dispense Refill   fluticasone  (FLONASE ) 50 MCG/ACT nasal spray Place 2 sprays into the nose daily. 16 g 12   levothyroxine (SYNTHROID) 112 MCG tablet Take 112 mcg by mouth every morning.     loratadine-pseudoephedrine (CLARITIN-D 12-HOUR) 5-120 MG tablet Take 1 tablet by mouth 2 (two) times daily.       trospium (SANCTURA) 20 MG tablet Take 20 mg by mouth 2 (two) times daily.     No current facility-administered medications for this visit.     REVIEW OF SYSTEMS: The patient reports that they are doing well generally. Pertinent ROS per HPI and otherwise negative.      PHYSICAL EXAM:  Wt Readings from Last 3 Encounters:  05/06/23 161 lb 6 oz (73.2 kg)  02/02/23 166 lb 8 oz (75.5 kg)  12/30/22 175 lb (79.4 kg)   Temp Readings from Last 3 Encounters:  05/06/23 98.7 F (37.1 C) (Temporal)  02/02/23 98.7 F (37.1 C) (Temporal)  12/30/22 98.2 F (36.8 C) (Oral)   BP Readings from Last 3 Encounters:  05/06/23 98/70  02/02/23 100/70  12/30/22 118/78   Pulse Readings from Last 3 Encounters:  05/06/23 99  02/02/23  73  12/30/22 89    /10   Physical Exam Vitals and nursing note reviewed.  Constitutional:      General: She is not in acute distress. HENT:     Head: Normocephalic and atraumatic.  Eyes:     Extraocular Movements: Extraocular movements intact.  Cardiovascular:     Rate and Rhythm: Normal rate.  Pulmonary:     Effort: Pulmonary effort is normal. No respiratory distress.  Abdominal:     General: There is no distension.  Musculoskeletal:         General: Normal range of motion.     Cervical back: Normal range of motion.  Skin:    Coloration: Skin is not jaundiced or pale.  Neurological:     General: No focal deficit present.     Mental Status: She is alert.  Psychiatric:        Mood and Affect: Mood normal.      ECOG = 0   LABORATORY DATA:  Lab Results  Component Value Date   WBC 7.1 05/06/2011   HGB 13.7 05/06/2011   HCT 41.2 05/06/2011   MCV 93.2 05/06/2011   PLT 231.0 05/06/2011   Lab Results  Component Value Date   NA 138 04/29/2023   K 4.0 04/29/2023   CL 103 04/29/2023   CO2 28 04/29/2023   Lab Results  Component Value Date   ALT 7 04/29/2023   AST 12 04/29/2023   ALKPHOS 51 04/29/2023   BILITOT 0.5 04/29/2023      RADIOGRAPHY: US  LT BREAST BX W LOC DEV 1ST LESION IMG BX SPEC US  GUIDE Addendum Date: 03/21/2024 ADDENDUM REPORT: 03/21/2024 08:12 ADDENDUM: PATHOLOGY revealed: Breast, left, needle core biopsy, inner, 9 o'clock, 2 cmfn, coil clip- INVASIVE MODERATELY DIFFERENTIATED DUCTAL ADENOCARCINOMA WITH LOBULAR FEATURES, GRADE 2- FOCAL LOW TO INTERMEDIATE GRADE DUCTAL CARCINOMA IN SITU, SOLID TYPE WITHOUT NECROSIS- OVERALL GRADE: 2- ANGIOLYMPHATIC INVASION NOT IDENTIFIED- TUMOR MEASURES 7 MM IN GREATEST LINEAR EXTENT. Pathology results are CONCORDANT with imaging findings, per Dr. Debby Satterfield. Pathology results and recommendations below were discussed with patient by telephone. Patient reported biopsy site doing well with no adverse symptoms, and only slight tenderness at the site. Post biopsy care instructions were reviewed, questions were answered and my direct phone number was provided. Patient was instructed to call [facility] for any additional questions or concerns related to biopsy site. RECOMMENDATION: Surgical and oncological consultation. Request for surgical consultation relayed to Olam Bunnell and Landry Finger at [facility]. Pathology results reported  by Mliss CHARM Molt RN 03/17/2024. Electronically Signed   By: Debby Satterfield M.D.   On: 03/21/2024 08:12   Result Date: 03/21/2024 CLINICAL DATA:  54 year old with a screening detected indeterminate 0.8 cm mass in the inner LEFT breast at 9 o'clock 2 cm from the nipple. EXAM: ULTRASOUND GUIDED LEFT BREAST CORE NEEDLE BIOPSY COMPARISON:  Previous exam(s). PROCEDURE: I met with the patient and we discussed the procedure of ultrasound-guided biopsy, including benefits and alternatives. We discussed the high likelihood of a successful procedure. We discussed the risks of the procedure, including infection, bleeding, tissue injury, clip migration, and inadequate sampling. Informed written consent was given. The usual time-out protocol was performed immediately prior to the procedure. Lesion quadrant: Inner breast, 9 o'clock location. Using sterile technique with chlorhexidine as skin antisepsis, 1% lidocaine and 1% lidocaine with epinephrine as local anesthetic, under direct ultrasound visualization, a 12 gauge [employer] Marquee core needle device placed through an 11  gauge introducer needle was used to perform biopsy of the mass in the inner breast using a medial approach. At the conclusion of the procedure, a coil shaped tissue marker clip was deployed into the biopsy cavity. The patient tolerated the procedure well without apparent immediate complications. Follow up 2 view mammogram was performed in order to confirm clip placement and was dictated separately. IMPRESSION: Ultrasound guided core needle biopsy of an indeterminate 0.8 cm mass in the inner LEFT breast at 9 o'clock 2 cm from the nipple. Electronically Signed: By: Debby Satterfield M.D. On: 03/14/2024 11:19   MM CLIP PLACEMENT LEFT Result Date: 03/14/2024 CLINICAL DATA:  Confirmation of clip placement after ultrasound-guided core needle biopsy of a 0.8 cm mass in the inner LEFT breast at 9 o'clock 2 cm from nipple. EXAM: 2D and 3D DIAGNOSTIC LEFT MAMMOGRAM  POST ULTRASOUND BIOPSY COMPARISON:  Previous exam(s). ACR Breast Density Category b: There are scattered areas of fibroglandular density. FINDINGS: 3D Mammographic images were obtained following ultrasound guided biopsy of an indeterminate 0.8 cm mass in the inner LEFT breast. The coil shaped tissue marking clip is appropriately positioned at the site of the biopsied mass in the inner breast at anterior depth, 9 o'clock location. Expected post biopsy changes are present without evidence of hematoma. IMPRESSION: Appropriate positioning of the coil shaped tissue marking clip at the site of the biopsied mass in the inner LEFT breast at anterior depth. Final Assessment: Post Procedure Mammograms for Marker Placement Electronically Signed   By: Debby Satterfield M.D.   On: 03/14/2024 11:19   MM 3D DIAGNOSTIC MAMMOGRAM UNILATERAL LEFT BREAST Result Date: 03/06/2024 CLINICAL DATA:  54 year old woman recalled for possible LEFT breast asymmetry. EXAM: DIGITAL DIAGNOSTIC UNILATERAL LEFT MAMMOGRAM WITH TOMOSYNTHESIS AND CAD; ULTRASOUND LEFT BREAST LIMITED TECHNIQUE: Left digital diagnostic mammography and breast tomosynthesis was performed. The images were evaluated with computer-aided detection. ; Targeted ultrasound examination of the left breast was performed. COMPARISON:  Previous exam(s). ACR Breast Density Category b: There are scattered areas of fibroglandular density. FINDINGS: LEFT: Mammogram: Spot compression CC and MLO views of the LEFT breast were obtained. Irregularly shaped mass with spiculated margins persists in the inner LEFT breast. Ultrasound: Targeted sonographic evaluation of the LEFT breast demonstrates an irregularly shaped mass with angular margins at 9 o'clock 2 CMFN measuring 0.8 x 0.7 x 0.8 cm, corresponding to the mammographic mass. No enlarged LEFT axillary lymph nodes are visualized. IMPRESSION: Suspicious 0.8 cm LEFT breast mass (9 o'clock 2 CMFN), corresponding to the mammographic mass.  RECOMMENDATION: Ultrasound-guided core needle biopsy of 0.8 cm LEFT breast mass. I have discussed the findings and recommendations with the patient. The biopsy procedure was explained to the patient and questions were answered. Patient expressed their understanding of the biopsy recommendation. Patient will be scheduled for biopsy at her earliest convenience by the schedulers. Ordering provider will be notified. If applicable, a reminder letter will be sent to the patient regarding the next appointment. BI-RADS CATEGORY  4: Suspicious. Electronically Signed   By: Aliene Lloyd M.D.   On: 03/06/2024 09:35   US  LIMITED ULTRASOUND INCLUDING AXILLA LEFT BREAST  Result Date: 03/06/2024 CLINICAL DATA:  54 year old woman recalled for possible LEFT breast asymmetry. EXAM: DIGITAL DIAGNOSTIC UNILATERAL LEFT MAMMOGRAM WITH TOMOSYNTHESIS AND CAD; ULTRASOUND LEFT BREAST LIMITED TECHNIQUE: Left digital diagnostic mammography and breast tomosynthesis was performed. The images were evaluated with computer-aided detection. ; Targeted ultrasound examination of the left breast was performed. COMPARISON:  Previous exam(s). ACR Breast Density Category b:  There are scattered areas of fibroglandular density. FINDINGS: LEFT: Mammogram: Spot compression CC and MLO views of the LEFT breast were obtained. Irregularly shaped mass with spiculated margins persists in the inner LEFT breast. Ultrasound: Targeted sonographic evaluation of the LEFT breast demonstrates an irregularly shaped mass with angular margins at 9 o'clock 2 CMFN measuring 0.8 x 0.7 x 0.8 cm, corresponding to the mammographic mass. No enlarged LEFT axillary lymph nodes are visualized. IMPRESSION: Suspicious 0.8 cm LEFT breast mass (9 o'clock 2 CMFN), corresponding to the mammographic mass. RECOMMENDATION: Ultrasound-guided core needle biopsy of 0.8 cm LEFT breast mass. I have discussed the findings and recommendations with the patient. The biopsy procedure was explained to  the patient and questions were answered. Patient expressed their understanding of the biopsy recommendation. Patient will be scheduled for biopsy at her earliest convenience by the schedulers. Ordering provider will be notified. If applicable, a reminder letter will be sent to the patient regarding the next appointment. BI-RADS CATEGORY  4: Suspicious. Electronically Signed   By: Aliene Lloyd M.D.   On: 03/06/2024 09:35     PATHOLOGY:  L Breast Biopsy 03/14/24:  Accession #: [id number]  Patient Name: [name], [name]  Visit # : [id number]   MRN: [id number]  Physician: Jerilynn Debby DRAFTS  DOB/Age [birth year] (Age: 54) Gender: F  Collected Date: 03/14/2024  Received Date: 03/14/2024   FINAL DIAGNOSIS        1. Breast, left, needle core biopsy, inner, 9 o'clock, 2cmfn, coil clip :       INVASIVE MODERATELY DIFFERENTIATED DUCTAL ADENOCARCINOMA WITH LOBULAR FEATURES,       GRADE 2 (3+2+1)       FOCAL LOW TO INTERMEDIATE GRADE DUCTAL CARCINOMA IN SITU, SOLID TYPE WITHOUT       NECROSIS       TUBULE FORMATION: SCORE 3       NUCLEAR PLEOMORPHISM: SCORE 2       MITOTIC COUNT: SCORE 1       TOTAL SCORE: 6       OVERALL GRADE: GRADE 2 (6/9)       ANGIOLYMPHATIC INVASION NOT IDENTIFIED       TUMOR MEASURES 7 MM IN GREATEST LINEAR EXTENT     ADDENDUM  1) Breast, left, needle core biopsy, inner, 9 o'clock, 2cmfn, coil clip  PROGNOSTIC INDICATORS   Results:  IMMUNOHISTOCHEMICAL AND MORPHOMETRIC ANALYSIS PERFORMED MANUALLY  The tumor cells are NEGATIVE for Her2 (0+).  Estrogen Receptor:  100%, POSITIVE, STRONG STAINING INTENSITY  Progesterone Receptor:  100%, POSITIVE, STRONG STAINING INTENSITY  Proliferation Marker Ki67:  15%    IMPRESSION/PLAN:   Patient with anatomic Stage I/prognostic Stage IA [id number] IDC of the L breast, Grade 2, ER+/PR+/Her2- who is seen pre-operatively for consideration of adjuvant radiation therapy. We discussed the role of breast irradiation in reducing the  risk of local recurrence and improving long-term disease control.  We reviewed the logistics of treatment in detail, including the need for a CT simulation for treatment planning, followed by several days for contouring, dosimetry, and quality assurance prior to starting therapy.   The planned course of radiation therapy will consist of daily treatments, Monday through Friday, over approximately 3 weeks. The decision between a moderately hypofractionated regimen and an ultrahypofractionated regimen will be made based on patient preference after discussion of the risks, benefits, and available data. It was explained that longer-term outcome data are available for moderate hypofractionation; however, based on current evidence, both approaches appear comparable in terms of disease  control and toxicity profiles.  We reviewed that each treatment session typically lasts only a few minutes, though positioning and setup may take additional time. The patient should plan to be in the department for approximately 45 minutes per visit. She will be evaluated by me at least once weekly during treatment to monitor for side effects, assess tolerance, and ensure it is safe to continue therapy.  We discussed acute and subacute side effects which are generally gradual in onset and typically include fatigue, skin erythema, tanning or hyperpigmentation, breast edema or firmness and mild tenderness.  Less common but possible side effects include desquamation of the skin, decreased range of motion of the shoulder, and transient changes in breast size and texture.  Long-term risk such as cosmetic changes, rare risk of rib fracture and cardiopulmonary effects were also reviewed.  Reassured patient that most acute side effects improve over weeks to months after completing therapy.   Patient verbalized understanding of these risks and benefits and she is in agreement with the plan. I will see her in follow-up after surgery. Final  treatment planning will depend on her postoperative pathology report, but we anticipate adjuvant whole breast radiation. Do not anticipate the need to radiate her axilla or any nodal regions.   --  Total time spent today in preparation for this visit was *** minutes. This included patient care, imaging and path review, documentation, multidisciplinary discussion and coordination of care and follow up.    [name]. Maritza, M.D.   "

## 2024-04-03 ENCOUNTER — Encounter: Payer: Self-pay | Admitting: Radiation Oncology

## 2024-04-03 ENCOUNTER — Ambulatory Visit
Admission: RE | Admit: 2024-04-03 | Discharge: 2024-04-03 | Attending: Radiation Oncology | Admitting: Radiation Oncology

## 2024-04-03 ENCOUNTER — Ambulatory Visit
Admission: RE | Admit: 2024-04-03 | Discharge: 2024-04-03 | Disposition: A | Discharge status: Home / Self Care | Source: Ambulatory Visit | Attending: Radiation Oncology | Admitting: Radiation Oncology

## 2024-04-03 VITALS — BP 131/76 | HR 85 | Temp 97.6°F | Resp 16 | Ht 68.0 in | Wt 177.8 lb

## 2024-04-03 DIAGNOSIS — Z170 Estrogen receptor positive status [ER+]: Secondary | ICD-10-CM | POA: Insufficient documentation

## 2024-04-03 DIAGNOSIS — Z79890 Hormone replacement therapy (postmenopausal): Secondary | ICD-10-CM | POA: Insufficient documentation

## 2024-04-03 DIAGNOSIS — Z1732 Human epidermal growth factor receptor 2 negative status: Secondary | ICD-10-CM | POA: Diagnosis not present

## 2024-04-03 DIAGNOSIS — F17210 Nicotine dependence, cigarettes, uncomplicated: Secondary | ICD-10-CM | POA: Insufficient documentation

## 2024-04-03 DIAGNOSIS — C50812 Malignant neoplasm of overlapping sites of left female breast: Secondary | ICD-10-CM | POA: Insufficient documentation

## 2024-04-03 DIAGNOSIS — Z803 Family history of malignant neoplasm of breast: Secondary | ICD-10-CM | POA: Insufficient documentation

## 2024-04-03 DIAGNOSIS — Z1721 Progesterone receptor positive status: Secondary | ICD-10-CM | POA: Diagnosis not present

## 2024-04-03 DIAGNOSIS — Z801 Family history of malignant neoplasm of trachea, bronchus and lung: Secondary | ICD-10-CM | POA: Insufficient documentation

## 2024-04-04 NOTE — Therapy (Signed)
 " OUTPATIENT PHYSICAL THERAPY BREAST CANCER BASELINE EVALUATION   Patient Name: [name] MRN: [id number] DOB:08/09/1970, 54 y.o., female Today's Date: 04/05/2024  END OF SESSION:  PT End of Session - 04/05/24 0904     Visit Number 1    Number of Visits 2    Date for Recertification  05/17/24    PT Start Time 0904    PT Stop Time 0951    PT Time Calculation (min) 47 min    Activity Tolerance Patient tolerated treatment well    Behavior During Therapy [facility] for tasks assessed/performed          Past Medical History:  Diagnosis Date   Depression    Past Surgical History:  Procedure Laterality Date   BREAST BIOPSY Left 03/14/2024   US  LT BREAST BX W LOC DEV 1ST LESION IMG BX SPEC US  GUIDE 03/14/2024 [facility] MAMMOGRAP[facility]   Patient Active Problem List   Diagnosis Date Noted   Cancer of overlapping sites of left female breast (HCC) 04/01/2024   History of melanoma 05/06/2023   SKIN LESIONS, MULTIPLE 11/27/2009   Tobacco abuse 03/14/2007    REFERRING PROVIDER: Dr. [name]  REFERRING DIAG: Left Breast CAncer  THERAPY DIAG:  Malignant neoplasm of left breast in female, estrogen receptor positive, unspecified site of breast (HCC)  Abnormal posture  Rationale for Evaluation and Treatment: Rehabilitation  ONSET DATE: 03/20/2024  SUBJECTIVE:                                                                                                                                                                                           SUBJECTIVE STATEMENT: Patient reports she is here today to be seen by her medical team for her newly diagnosed left breast cancer.   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 It measures .8 cm and is located in the 9:00 position 2 cm from the nipple. It is ER+,PR+, HER 2- with a Ki67 of 15%.   PATIENT GOALS:   reduce lymphedema risk and learn post op HEP.   PAIN:   Are you having pain? No  PRECAUTIONS: Active CA   RED FLAGS: None   HAND DOMINANCE: right  WEIGHT BEARING RESTRICTIONS: No  FALLS:  Has patient fallen in last 6 months? No  LIVING ENVIRONMENT: Patient lives with: husband and 2 of her children 75 Lives in: House/apartment Has following equipment at home:   OCCUPATION: Medical billing  LEISURE: attend ball games for sons  PRIOR LEVEL OF FUNCTION: Independent   OBJECTIVE: Note: Objective measures were completed at Evaluation unless  otherwise noted.  COGNITION: Overall cognitive status: Within functional limits for tasks assessed    POSTURE:  Forward head and rounded shoulders posture  UPPER EXTREMITY AROM/PROM:  A/PROM RIGHT   eval   Shoulder extension 56  Shoulder flexion 136  Shoulder abduction 175  Shoulder internal rotation 67  Shoulder external rotation 105    (Blank rows = not tested)  A/PROM LEFT   eval  Shoulder extension 68  Shoulder flexion 144  Shoulder abduction 174  Shoulder internal rotation 63  Shoulder external rotation 95    (Blank rows = not tested)  CERVICAL AROM: All within fxl limits:    UPPER EXTREMITY STRENGTH: WNL  LYMPHEDEMA ASSESSMENTS (in cm):   LANDMARK RIGHT   eval  10 cm proximal to olecranon process from proximal aspect of olecranon 30.2  Olecranon process 25.9  10 cm proximal to ulnar styloid process from proximal aspect of styloid process 22  Just distal to ulnar styloid process 15.5  Across hand at thumb web space 19.9  At base of 2nd digit 6.3  (Blank rows = not tested)  LANDMARK LEFT   eval  10 cm proximal to olecranon process from proximal aspect of olecranon 29.2   Olecranon process 26.0  10 cm proximal to ulnar styloid process from proximal aspect of styloid process 21.2  Just distal to ulnar styloid process 15.3  Across hand at thumb web space 19.1  At base of 2nd digit 6.3  (Blank rows = not tested)  L-DEX LYMPHEDEMA SCREENING:  The patient  was assessed using the L-Dex machine today to produce a lymphedema index baseline score. The patient will be reassessed on a regular basis (typically every 3 months) to obtain new L-Dex scores. If the score is > 6.5 points away from his/her baseline score indicating onset of subclinical lymphedema, it will be recommended to wear a compression garment for 4 weeks, 12 hours per day and then be reassessed. If the score continues to be > 6.5 points from baseline at reassessment, we will initiate lymphedema treatment. Assessing in this manner has a 95% rate of preventing clinically significant lymphedema.    QUICK DASH SURVEY: 2.27  PATIENT EDUCATION:  Education details: Time spent educating patient on aspects of self-care to maximize post op recovery. Patient was educated on where and how to get a post op compression bra to use to reduce post op edema. Patient was also educated on the use of SOZO screenings and surveillance principles for early identification of lymphedema onset. She was instructed to use the post op pillow in the axilla for pressure and pain relief. Patient educated on lymphedema risk reduction and post op shoulder/posture HEP. Person educated: Patient Education method: Explanation, Demonstration, Handout Education comprehension: Patient verbalized understanding and returned demonstration  HOME EXERCISE PROGRAM: Patient was instructed today in a home exercise program today for post op shoulder range of motion. These included active assist shoulder flexion in sitting, scapular retraction, wall walking with shoulder abduction, and hands behind head external rotation.  She was encouraged to do these twice a day, holding 3 seconds and repeating 5 times when permitted by her physician.   ASSESSMENT:  CLINICAL IMPRESSION: Pts multidisciplinary medical team met prior to her assessments to determine a recommended treatment plan. She is planning to have a Left Lumpectomy with SLNB on  04/13/2024 . She will benefit from a post op PT reassessment to determine needs and from L-Dex screens every 3 months for 2 years to detect subclinical lymphedema.  Pt will benefit from skilled therapeutic intervention to improve on the following deficits: Decreased knowledge of precautions, impaired UE functional use, pain, decreased ROM, postural dysfunction.   PT treatment/interventions: ADL/self-care home management, pt/family education, therapeutic exercise  REHAB POTENTIAL: Excellent  CLINICAL DECISION MAKING: Stable/uncomplicated  EVALUATION COMPLEXITY: Low   GOALS: Goals reviewed with patient? YES  LONG TERM GOALS: (STG=LTG)    Name Target Date Goal status  1 Pt will be able to verbalize understanding of pertinent lymphedema risk reduction practices relevant to her dx specifically related to skin care.  Baseline:  No knowledge 04/05/2024 Achieved at eval  2 Pt will be able to return demo and/or verbalize understanding of the post op HEP related to regaining shoulder ROM. Baseline:  No knowledge 04/05/2024 Achieved at eval  3 Pt will be able to verbalize understanding of the importance of viewing the post op After Breast CA Class video for further lymphedema risk reduction education and therapeutic exercise.  Baseline:  No knowledge 04/05/2024 Achieved at eval  4 Pt will demo she has regained full shoulder ROM and function post operatively compared to baselines.  Baseline: See objective measurements taken today. 3/4/20256     PLAN:  PT FREQUENCY/DURATION: EVAL and 1 follow up appointment.   PLAN FOR NEXT SESSION: will reassess 3-4 weeks post op to determine needs.   Patient will follow up at outpatient cancer rehab 3-4 weeks following surgery.  If the patient requires physical therapy at that time, a specific plan will be dictated and sent to the referring physician for approval. The patient was educated today on appropriate basic range of motion exercises to begin post  operatively and the importance of viewing the After Breast Cancer class video following surgery.  Patient was educated today on lymphedema risk reduction practices as it pertains to recommendations that will benefit the patient immediately following surgery.  She verbalized good understanding.    Physical Therapy Information for After Breast Cancer Surgery/Treatment:  Lymphedema is a swelling condition that you may be at risk for in your arm if you have lymph nodes removed from the armpit area.  After a sentinel node biopsy, the risk is approximately 5-9% and is higher after an axillary node dissection.  There is treatment available for this condition and it is not life-threatening.  Contact your physician or physical therapist with concerns. You may begin the 4 shoulder/posture exercises (see additional sheet) when permitted by your physician (typically a week after surgery).  If you have drains, you may need to wait until those are removed before beginning range of motion exercises.  A general recommendation is to not lift your arms above shoulder height until drains are removed.  These exercises should be done to your tolerance and gently.  This is not a no pain/no gain type of recovery so listen to your body and stretch into the range of motion that you can tolerate, stopping if you have pain.  If you are having immediate reconstruction, ask your plastic surgeon about doing exercises as he or she may want you to wait. We encourage you to view the After Breast Cancer class video following surgery.  You will learn information related to lymphedema risk, prevention and treatment and additional exercises to regain mobility following surgery.   While undergoing any medical procedure or treatment, try to avoid blood pressure being taken or needle sticks from occurring on the arm on the side of cancer.   This recommendation begins after surgery and continues for the rest  of your life.  This may help reduce  your risk of getting lymphedema (swelling in your arm). An excellent resource for those seeking information on lymphedema is the National Lymphedema Network's web site. It can be accessed at www.lymphnet.org If you notice swelling in your hand, arm or breast at any time following surgery (even if it is many years from now), please contact your doctor or physical therapist to discuss this.  Lymphedema can be treated at any time but it is easier for you if it is treated early on.  If you feel like your shoulder motion is not returning to normal in a reasonable amount of time, please contact your surgeon or physical therapist.  [facility] [phone]. [address], [place] [address]  [facility] CLASS After Breast Cancer Class  After Breast Cancer Class is a specially designed exercise class video to assist you in a safe recover after having breast cancer surgery.  In this video you will learn how to get back to full function whether your drains were just removed or if you had surgery a month ago. The video can be viewed on this page: [email or web address].boyd-meyer.org/ or on YouTube here: https://youtu.az/[email or web address].  Class Goals  Understand specific stretches to improve the flexibility of you chest and shoulder. Learn ways to safely strengthen your upper body and improve your posture. Understand the warning signs of infection and why you may be at risk for an arm infection. Learn about Lymphedema and prevention.  ** You do not need to view this video until after surgery.  Drains should be removed to participate in the recommended exercises on the video.  Patient was instructed today in a home exercise program today for post op shoulder range of motion. These included active assist shoulder flexion in sitting, scapular retraction, wall walking with shoulder abduction, and hands behind head external  rotation.  She was encouraged to do these twice a day, holding 3 seconds and repeating 5 times when permitted by her physician.    Grayce JINNY Sheldon, PT 04/05/2024, 9:51 AM   "

## 2024-04-05 ENCOUNTER — Ambulatory Visit: Attending: Surgery

## 2024-04-05 ENCOUNTER — Other Ambulatory Visit: Payer: Self-pay

## 2024-04-05 DIAGNOSIS — C50912 Malignant neoplasm of unspecified site of left female breast: Secondary | ICD-10-CM | POA: Insufficient documentation

## 2024-04-05 DIAGNOSIS — Z170 Estrogen receptor positive status [ER+]: Secondary | ICD-10-CM | POA: Diagnosis present

## 2024-04-05 DIAGNOSIS — R293 Abnormal posture: Secondary | ICD-10-CM | POA: Insufficient documentation

## 2024-04-06 ENCOUNTER — Encounter (HOSPITAL_BASED_OUTPATIENT_CLINIC_OR_DEPARTMENT_OTHER): Payer: Self-pay | Admitting: Surgery

## 2024-04-06 ENCOUNTER — Other Ambulatory Visit: Payer: Self-pay

## 2024-04-11 ENCOUNTER — Other Ambulatory Visit: Payer: Self-pay | Admitting: Surgery

## 2024-04-11 ENCOUNTER — Inpatient Hospital Stay

## 2024-04-11 ENCOUNTER — Encounter: Payer: Self-pay | Admitting: *Deleted

## 2024-04-11 ENCOUNTER — Inpatient Hospital Stay: Attending: Hematology and Oncology | Admitting: Hematology and Oncology

## 2024-04-11 ENCOUNTER — Ambulatory Visit
Admission: RE | Admit: 2024-04-11 | Discharge: 2024-04-11 | Disposition: A | Source: Ambulatory Visit | Attending: Surgery | Admitting: Surgery

## 2024-04-11 ENCOUNTER — Inpatient Hospital Stay
Admission: RE | Admit: 2024-04-11 | Discharge: 2024-04-11 | Disposition: A | Source: Ambulatory Visit | Attending: Surgery | Admitting: Surgery

## 2024-04-11 VITALS — BP 137/78 | HR 79 | Temp 97.8°F | Resp 17 | Ht 68.0 in | Wt 176.4 lb

## 2024-04-11 DIAGNOSIS — C50812 Malignant neoplasm of overlapping sites of left female breast: Secondary | ICD-10-CM

## 2024-04-11 DIAGNOSIS — Z170 Estrogen receptor positive status [ER+]: Secondary | ICD-10-CM | POA: Diagnosis not present

## 2024-04-11 DIAGNOSIS — Z853 Personal history of malignant neoplasm of breast: Secondary | ICD-10-CM

## 2024-04-11 LAB — CBC WITH DIFFERENTIAL/PLATELET
Abs Immature Granulocytes: 0.01 10*3/uL (ref 0.00–0.07)
Basophils Absolute: 0.1 10*3/uL (ref 0.0–0.1)
Basophils Relative: 1 %
Eosinophils Absolute: 0.1 10*3/uL (ref 0.0–0.5)
Eosinophils Relative: 2 %
HCT: 40.2 % (ref 36.0–46.0)
Hemoglobin: 13.4 g/dL (ref 12.0–15.0)
Immature Granulocytes: 0 %
Lymphocytes Relative: 42 %
Lymphs Abs: 2.6 10*3/uL (ref 0.7–4.0)
MCH: 29.8 pg (ref 26.0–34.0)
MCHC: 33.3 g/dL (ref 30.0–36.0)
MCV: 89.5 fL (ref 80.0–100.0)
Monocytes Absolute: 0.4 10*3/uL (ref 0.1–1.0)
Monocytes Relative: 7 %
Neutro Abs: 3 10*3/uL (ref 1.7–7.7)
Neutrophils Relative %: 48 %
Platelets: 226 10*3/uL (ref 150–400)
RBC: 4.49 MIL/uL (ref 3.87–5.11)
RDW: 11.9 % (ref 11.5–15.5)
WBC: 6.2 10*3/uL (ref 4.0–10.5)
nRBC: 0 % (ref 0.0–0.2)

## 2024-04-11 LAB — CMP (CANCER CENTER ONLY)
ALT: 31 U/L (ref 0–44)
AST: 25 U/L (ref 15–41)
Albumin: 4.5 g/dL (ref 3.5–5.0)
Alkaline Phosphatase: 68 U/L (ref 38–126)
Anion gap: 10 (ref 5–15)
BUN: 11 mg/dL (ref 6–20)
CO2: 27 mmol/L (ref 22–32)
Calcium: 9.6 mg/dL (ref 8.9–10.3)
Chloride: 103 mmol/L (ref 98–111)
Creatinine: 0.86 mg/dL (ref 0.44–1.00)
GFR, Estimated: >60 mL/min
Glucose, Bld: 82 mg/dL (ref 70–99)
Potassium: 4.3 mmol/L (ref 3.5–5.1)
Sodium: 140 mmol/L (ref 135–145)
Total Bilirubin: 0.3 mg/dL (ref 0.0–1.2)
Total Protein: 7.5 g/dL (ref 6.5–8.1)

## 2024-04-11 NOTE — Research (Signed)
 [employer] 2021-05 - Specimen Collection Study to Evaluate Biomarkers in Subjects with Cancer    Patient [name] was identified by Dr. Loretha as a potential candidate for the above listed study.  This Clinical Research Coordinator met with [name], [id number], on 04/11/24 in a manner and location that ensures patient privacy to discuss participation in the above listed research study.  Patient is Accompanied by husband.  A copy of the informed consent document with embedded HIPAA language was provided to the patient.  Patient reads, speaks, and understands English.   Patient was provided with the business card of this Coordinator and encouraged to contact the research team with any questions.  Approximately 10 minutes were spent with the patient reviewing the informed consent documents.  Patient was provided the option of taking informed consent documents home to review and was encouraged to review at their convenience with their support network, including other care providers. Patient is comfortable with making a decision regarding study participation today. Patient declined participation. Mentioned lack of time until procedure date. Dr. Loretha notified.  Laury Quale, MPH  Clinical Research Coordinator

## 2024-04-11 NOTE — Progress Notes (Signed)
 Met patient and initial med onc with Dr. Loretha. Husband present at visit. Introduced role of navigation and gave her my contact information. Gave her Journey Notebook and Breast Cancer book. Oncotype ordered for after path back. Will have f/u with Dr. Loretha to discuss.

## 2024-04-11 NOTE — Progress Notes (Unsigned)
 [facility] CONSULT NOTE  Patient Care Team: Avelina Greig BRAVO, MD as PCP - General Ob/Gyn, Landy Posey Collet, Devere HERO, RN as Oncology Nurse Navigator  CHIEF COMPLAINTS/PURPOSE OF CONSULTATION:  Newly diagnosed breast cancer  HISTORY OF PRESENTING ILLNESS:  [name] 54 y.o. female is here because of recent diagnosis of left breast cancer  I reviewed her records extensively and collaborated the history with the patient.  SUMMARY OF ONCOLOGIC HISTORY: Oncology History   No problem history exists.    MEDICAL HISTORY:  Past Medical History:  Diagnosis Date   Depression     SURGICAL HISTORY: Past Surgical History:  Procedure Laterality Date   BREAST BIOPSY Left 03/14/2024   US  LT BREAST BX W LOC DEV 1ST LESION IMG BX SPEC US  GUIDE 03/14/2024 [facility] MAMMOGRAPHY   BREAST BIOPSY Left 04/11/2024   US  LT RADIOACTIVE SEED LOC 04/11/2024 [facility] MAMMOGRAPHY    SOCIAL HISTORY: Social History   Socioeconomic History   Marital status: Married    Spouse name: Not on file   Number of children: Not on file   Years of education: Not on file   Highest education level: Some college, no degree  Occupational History   Not on file  Tobacco Use   Smoking status: Former    Types: Cigarettes   Smokeless tobacco: Not on file  Vaping Use   Vaping status: Every Day  Substance and Sexual Activity   Alcohol use: Yes   Drug use: No   Sexual activity: Not on file  Other Topics Concern   Not on file  Social History Narrative   No regular exercise            Social Drivers of Health   Tobacco Use: Medium Risk (04/06/2024)   Patient History    Smoking Tobacco Use: Former    Smokeless Tobacco Use: Unknown    Passive Exposure: Not on Actuary Strain: Low Risk (05/06/2023)   Overall Financial Resource Strain (CARDIA)    Difficulty of Paying Living Expenses: Not hard at all  Food Insecurity: No Food Insecurity (04/10/2024)   Epic    Worried About  Radiation Protection Practitioner of Food in the Last Year: Never true    Ran Out of Food in the Last Year: Never true  Transportation Needs: No Transportation Needs (04/10/2024)   Epic    Lack of Transportation (Medical): No    Lack of Transportation (Non-Medical): No  Physical Activity: Insufficiently Active (05/06/2023)   Exercise Vital Sign    Days of Exercise per Week: 3 days    Minutes of Exercise per Session: 30 min  Stress: Stress Concern Present (05/06/2023)   [employer] of Occupational Health - Occupational Stress Questionnaire    Feeling of Stress : To some extent  Social Connections: Unknown (05/06/2023)   Social Connection and Isolation Panel    Frequency of Communication with Friends and Family: More than three times a week    Frequency of Social Gatherings with Friends and Family: More than three times a week    Attends Religious Services: Patient declined    Active Member of Clubs or Organizations: Yes    Attends Banker Meetings: More than 4 times per year    Marital Status: Married  Catering Manager Violence: Not on file  Depression (PHQ2-9): Low Risk (05/06/2023)   Depression (PHQ2-9)    PHQ-2 Score: 0  Alcohol Screen: Low Risk (05/06/2023)   Alcohol Screen    Last Alcohol  Screening Score (AUDIT): 1  Housing: Low Risk (04/10/2024)   Epic    Unable to Pay for Housing in the Last Year: No    Number of Times Moved in the Last Year: 0    Homeless in the Last Year: No  Utilities: Not At Risk (04/10/2024)   Epic    Threatened with loss of utilities: No  Health Literacy: Not on file    FAMILY HISTORY: Family History  Problem Relation Age of Onset   Rheum arthritis Mother    Breast cancer Paternal Aunt     ALLERGIES:  has no known allergies.  MEDICATIONS:  Current Outpatient Medications  Medication Sig Dispense Refill   loratadine-pseudoephedrine (CLARITIN-D 12-HOUR) 5-120 MG tablet Take 1 tablet by mouth 2 (two) times daily.       trospium (SANCTURA) 20 MG tablet  Take 20 mg by mouth 2 (two) times daily.     fluticasone  (FLONASE ) 50 MCG/ACT nasal spray Place 2 sprays into the nose daily. 16 g 12   No current facility-administered medications for this visit.    REVIEW OF SYSTEMS:   Constitutional: Denies fevers, chills or abnormal night sweats Eyes: Denies blurriness of vision, double vision or watery eyes Ears, nose, mouth, throat, and face: Denies mucositis or sore throat Respiratory: Denies cough, dyspnea or wheezes Cardiovascular: Denies palpitation, chest discomfort or lower extremity swelling Gastrointestinal:  Denies nausea, heartburn or change in bowel habits Skin: Denies abnormal skin rashes Lymphatics: Denies new lymphadenopathy or easy bruising Neurological:Denies numbness, tingling or new weaknesses Behavioral/Psych: Mood is stable, no new changes  Breast: *** Denies any palpable lumps or discharge All other systems were reviewed with the patient and are negative.  PHYSICAL EXAMINATION: ECOG PERFORMANCE STATUS: {CHL ONC ECOG ED:[id number]}  Vitals:   04/11/24 1517  BP: 137/78  Pulse: 79  Resp: 17  Temp: 97.8 F (36.6 C)  SpO2: 98%   Filed Weights   04/11/24 1517  Weight: 176 lb 6.4 oz (80 kg)    GENERAL:alert, no distress and comfortable SKIN: skin color, texture, turgor are normal, no rashes or significant lesions EYES: normal, conjunctiva are pink and non-injected, sclera clear OROPHARYNX:no exudate, no erythema and lips, buccal mucosa, and tongue normal  NECK: supple, thyroid normal size, non-tender, without nodularity LYMPH:  no palpable lymphadenopathy in the cervical, axillary or inguinal LUNGS: clear to auscultation and percussion with normal breathing effort HEART: regular rate & rhythm and no murmurs and no lower extremity edema ABDOMEN:abdomen soft, non-tender and normal bowel sounds Musculoskeletal:no cyanosis of digits and no clubbing  PSYCH: alert & oriented x 3 with fluent speech NEURO: no focal  motor/sensory deficits BREAST:*** No palpable nodules in breast. No palpable axillary or supraclavicular lymphadenopathy (exam performed in the presence of a chaperone)   LABORATORY DATA:  I have reviewed the data as listed Lab Results  Component Value Date   WBC 7.1 05/06/2011   HGB 13.7 05/06/2011   HCT 41.2 05/06/2011   MCV 93.2 05/06/2011   PLT 231.0 05/06/2011   Lab Results  Component Value Date   NA 138 04/29/2023   K 4.0 04/29/2023   CL 103 04/29/2023   CO2 28 04/29/2023    RADIOGRAPHIC STUDIES: I have personally reviewed the radiological reports and agreed with the findings in the report.  ASSESSMENT AND PLAN:  No problem-specific Assessment & Plan notes found for this encounter.   All questions were answered. The patient knows to call the clinic with any problems, questions or concerns.  Amber Stalls, MD 04/11/24

## 2024-04-12 NOTE — H&P (Signed)
 " REFERRING PHYSICIAN: Lane Pfeiffer* PROVIDER: VICENTA DASIE POLI, MD MRN: [id number] DOB: 02/19/1971 DATE OF ENCOUNTER: 03/24/2024 Subjective   Chief Complaint: breast cancer  History of Present Illness: [name] is a 54 y.o. female who is seen today as an office consultation for evaluation of left breast cancer  This is a 54 year old female who was found to have an abnormality on screening mammography of the left breast. She had further imaging showing an 8 mm mass at the 9 o'clock position 2 cm from the nipple. 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 It was 100% ER positive, 100% PR positive, HER2 negative, and had a Ki-67 of 15%. She has had no previous abnormality guarding her breast. There is no family history of breast cancer. She denies nipple discharge. She is otherwise healthy without complaints  Review of Systems: A complete review of systems was obtained from the patient. I have reviewed this information and discussed as appropriate with the patient. See HPI as well for other ROS.  ROS   Medical History: History reviewed. No pertinent past medical history.  There is no problem list on file for this patient.  Past Surgical History:  Procedure Laterality Date  CESAREAN SECTION    No Known Allergies  Current Outpatient Medications on File Prior to Visit  Medication Sig Dispense Refill  trospium (SANCTURA) 20 mg tablet 20 mg twice a day   No current facility-administered medications on file prior to visit.   Family History  Problem Relation Age of Onset  Obesity Father    Social History   Tobacco Use  Smoking Status Never  Smokeless Tobacco Never    Social History   Socioeconomic History  Marital status: Married  Tobacco Use  Smoking status: Never  Smokeless tobacco: Never  Vaping Use  Vaping status: Every Day  Substance and Sexual Activity  Drug use: Never   Social Drivers of Research Scientist (physical Sciences) Strain: Low Risk (05/06/2023)  Received from [facility]  Overall Financial Resource Strain (CARDIA)  Difficulty of Paying Living Expenses: Not hard at all  Food Insecurity: No Food Insecurity (05/06/2023)  Received from [facility]  Hunger Vital Sign  Within the past 12 months, you worried that your food would run out before you got the money to buy more.: Never true  Within the past 12 months, the food you bought just didn't last and you didn't have money to get more.: Never true  Transportation Needs: No Transportation Needs (05/06/2023)  Received from [facility]  Lack of Transportation (Medical): No  Lack of Transportation (Non-Medical): No  Physical Activity: Insufficiently Active (05/06/2023)  Received from [facility]  Exercise Vital Sign  On average, how many days per week do you engage in moderate to strenuous exercise (like a brisk walk)?: 3 days  On average, how many minutes do you engage in exercise at this level?: 30 min  Stress: Stress Concern Present (05/06/2023)  Received from [facility] - Occupational Stress Questionnaire  Feeling of Stress : To some extent  Social Connections: Unknown (05/06/2023)  Received from [facility]  Social Connection and Isolation Panel  In a typical week, how many times do you talk on the phone with family, friends, or neighbors?: More than three times a week  How often do you get together with friends or relatives?: More than three times a week  How often do you attend church or  religious services?: Patient declined  Do you belong to any clubs or organizations such as church groups, unions, fraternal or athletic groups, or school groups?: Yes  How often do you attend meetings of the clubs or organizations you belong to?: More than 4 times per year  Are you married, widowed, divorced, separated, never married, or living with a partner?: Married  Housing Stability:  Unknown (03/24/2024)  Housing Stability Vital Sign  Homeless in the Last Year: No   Objective:   Vitals:  03/24/24 0950  BP: (!) 154/98  Pulse: 102  Temp: 36.7 C (98 F)  SpO2: 98%  Weight: 78 kg (172 lb)  Height: 172.7 cm (5' 8)   Body mass index is 26.15 kg/m.  Physical Exam   She appears well on exam  A chaperone was present for the exam  She has significant ecchymosis and a hematoma at the 9 o'clock position from the previous biopsy of the left breast. There are no other palpable abnormalities. The nipple areolar complex is normal. The right breast is normal.  There is no axillary adenopathy on either side  Labs, Imaging and Diagnostic Testing: I have reviewed her mammograms, ultrasound, and pathology results  Assessment and Plan:   Diagnoses and all orders for this visit:  Invasive ductal carcinoma of breast, left (CMS/HHS-HCC) - Ambulatory Referral to Oncology-Medical - Ambulatory Referral to Radiation Oncology - Ambulatory Referral to Physical Therapy - Ambulatory Referral to Cancer Genetics - [id number]   I discussed the diagnosis with the patient and her husband and gave them a copy the pathology results. We discussed the multidisciplinary approach to breast cancer which would involve the medical oncologist, radiation oncologist, physical therapist, geneticist as well. From a surgical standpoint we did not discussed breast conservation versus mastectomy. They are interested in breast conservation so we next discussed proceeding with a radioactive seed guided left breast lumpectomy and sentinel lymph node biopsy. We discussed the reasons to biopsy lymph nodes as well. I explained the surgical procedures in detail. We discussed the risks which includes but is not limited to bleeding, infection, the need for further surgery if margins or lymph nodes are positive, seroma formation, chronic lymphedema, injury to surrounding structures, cardiopulmonary issues with anesthesia,  postoperative recovery, excetra. They understand and agree with the plans and surgery will be scheduled to soon as possible. Referrals were also placed.  "

## 2024-04-13 ENCOUNTER — Ambulatory Visit (HOSPITAL_BASED_OUTPATIENT_CLINIC_OR_DEPARTMENT_OTHER)
Admission: RE | Admit: 2024-04-13 | Discharge: 2024-04-13 | Disposition: A | Discharge status: Home / Self Care | Attending: Surgery | Admitting: Surgery

## 2024-04-13 ENCOUNTER — Encounter (HOSPITAL_BASED_OUTPATIENT_CLINIC_OR_DEPARTMENT_OTHER): Payer: Self-pay | Admitting: Surgery

## 2024-04-13 ENCOUNTER — Ambulatory Visit (HOSPITAL_BASED_OUTPATIENT_CLINIC_OR_DEPARTMENT_OTHER): Admitting: Certified Registered Nurse Anesthetist

## 2024-04-13 ENCOUNTER — Ambulatory Visit
Admission: RE | Admit: 2024-04-13 | Discharge: 2024-04-13 | Disposition: A | Discharge status: Home / Self Care | Source: Ambulatory Visit | Attending: Surgery | Admitting: Surgery

## 2024-04-13 ENCOUNTER — Other Ambulatory Visit: Payer: Self-pay

## 2024-04-13 ENCOUNTER — Encounter (HOSPITAL_BASED_OUTPATIENT_CLINIC_OR_DEPARTMENT_OTHER): Admission: RE | Disposition: A | Payer: Self-pay | Source: Home / Self Care | Attending: Surgery

## 2024-04-13 DIAGNOSIS — N6022 Fibroadenosis of left breast: Secondary | ICD-10-CM | POA: Insufficient documentation

## 2024-04-13 DIAGNOSIS — Z170 Estrogen receptor positive status [ER+]: Secondary | ICD-10-CM | POA: Diagnosis not present

## 2024-04-13 DIAGNOSIS — C50912 Malignant neoplasm of unspecified site of left female breast: Secondary | ICD-10-CM

## 2024-04-13 DIAGNOSIS — C50812 Malignant neoplasm of overlapping sites of left female breast: Secondary | ICD-10-CM | POA: Diagnosis present

## 2024-04-13 DIAGNOSIS — Z01818 Encounter for other preprocedural examination: Secondary | ICD-10-CM

## 2024-04-13 DIAGNOSIS — Z853 Personal history of malignant neoplasm of breast: Secondary | ICD-10-CM

## 2024-04-13 DIAGNOSIS — F17290 Nicotine dependence, other tobacco product, uncomplicated: Secondary | ICD-10-CM | POA: Diagnosis not present

## 2024-04-13 HISTORY — DX: Malignant (primary) neoplasm, unspecified: C80.1

## 2024-04-13 MED ORDER — MAGTRACE LYMPHATIC TRACER
INTRAMUSCULAR | Status: DC | PRN
  Administered 2024-04-13: 2 mL via INTRAMUSCULAR

## 2024-04-13 MED ORDER — PROPOFOL 10 MG/ML IV BOLUS
INTRAVENOUS | Status: AC
  Filled 2024-04-13: qty 20

## 2024-04-13 MED ORDER — CHLORHEXIDINE GLUCONATE CLOTH 2 % EX PADS
6.0000 | MEDICATED_PAD | Freq: Once | CUTANEOUS | Status: AC
  Administered 2024-04-13: 6 via TOPICAL

## 2024-04-13 MED ORDER — ACETAMINOPHEN 500 MG PO TABS
1000.0000 mg | ORAL_TABLET | ORAL | Status: AC
  Administered 2024-04-13: 1000 mg via ORAL

## 2024-04-13 MED ORDER — FENTANYL CITRATE (PF) 100 MCG/2ML IJ SOLN
50.0000 ug | Freq: Once | INTRAMUSCULAR | Status: AC
  Administered 2024-04-13: 50 ug via INTRAVENOUS

## 2024-04-13 MED ORDER — CEFAZOLIN SODIUM-DEXTROSE 2-4 GM/100ML-% IV SOLN
INTRAVENOUS | Status: AC
  Filled 2024-04-13: qty 100

## 2024-04-13 MED ORDER — MIDAZOLAM HCL (PF) 2 MG/2ML IJ SOLN
2.0000 mg | Freq: Once | INTRAMUSCULAR | Status: AC
  Administered 2024-04-13: 2 mg via INTRAVENOUS

## 2024-04-13 MED ORDER — FENTANYL CITRATE (PF) 100 MCG/2ML IJ SOLN
INTRAMUSCULAR | Status: DC | PRN
  Administered 2024-04-13: 25 ug via INTRAVENOUS
  Administered 2024-04-13: 50 ug via INTRAVENOUS
  Administered 2024-04-13: 25 ug via INTRAVENOUS

## 2024-04-13 MED ORDER — CHLORHEXIDINE GLUCONATE CLOTH 2 % EX PADS: 6.0000 | MEDICATED_PAD | Freq: Once | CUTANEOUS | Status: DC

## 2024-04-13 MED ORDER — MIDAZOLAM HCL 2 MG/2ML IJ SOLN
INTRAMUSCULAR | Status: AC
  Filled 2024-04-13: qty 2

## 2024-04-13 MED ORDER — ONDANSETRON HCL 4 MG/2ML IJ SOLN
INTRAMUSCULAR | Status: AC
  Filled 2024-04-13: qty 2

## 2024-04-13 MED ORDER — TRAMADOL HCL 50 MG PO TABS: 50.0000 mg | ORAL_TABLET | Freq: Four times a day (QID) | ORAL | 0 refills | Status: AC | PRN

## 2024-04-13 MED ORDER — BUPIVACAINE-EPINEPHRINE (PF) 0.5% -1:200000 IJ SOLN
INTRAMUSCULAR | Status: AC
  Filled 2024-04-13: qty 30

## 2024-04-13 MED ORDER — LACTATED RINGERS IV SOLN: INTRAVENOUS | Status: DC | PRN

## 2024-04-13 MED ORDER — CEFAZOLIN SODIUM-DEXTROSE 2-4 GM/100ML-% IV SOLN: 2.0000 g | INTRAVENOUS | Status: DC

## 2024-04-13 MED ORDER — PROPOFOL 10 MG/ML IV BOLUS
INTRAVENOUS | Status: DC | PRN
  Administered 2024-04-13: 170 mg via INTRAVENOUS

## 2024-04-13 MED ORDER — FENTANYL CITRATE (PF) 100 MCG/2ML IJ SOLN
INTRAMUSCULAR | Status: AC
  Filled 2024-04-13: qty 2

## 2024-04-13 MED ORDER — CLONIDINE HCL (ANALGESIA) 100 MCG/ML EP SOLN
EPIDURAL | Status: DC | PRN
  Administered 2024-04-13: 100 ug

## 2024-04-13 MED ORDER — ACETAMINOPHEN 500 MG PO TABS
ORAL_TABLET | ORAL | Status: AC
  Filled 2024-04-13: qty 2

## 2024-04-13 MED ORDER — ONDANSETRON HCL 4 MG/2ML IJ SOLN
INTRAMUSCULAR | Status: DC | PRN
  Administered 2024-04-13: 4 mg via INTRAVENOUS

## 2024-04-13 MED ORDER — BUPIVACAINE-EPINEPHRINE 0.5% -1:200000 IJ SOLN
INTRAMUSCULAR | Status: DC | PRN
  Administered 2024-04-13: 18 mL

## 2024-04-13 MED ORDER — DEXAMETHASONE SOD PHOSPHATE PF 10 MG/ML IJ SOLN
INTRAMUSCULAR | Status: DC | PRN
  Administered 2024-04-13: 5 mg via INTRAVENOUS

## 2024-04-13 MED ORDER — LIDOCAINE HCL (CARDIAC) PF 100 MG/5ML IV SOSY
PREFILLED_SYRINGE | INTRAVENOUS | Status: DC | PRN
  Administered 2024-04-13: 100 mg via INTRAVENOUS

## 2024-04-13 MED ORDER — ENSURE PRE-SURGERY PO LIQD: 296.0000 mL | Freq: Once | ORAL | Status: DC

## 2024-04-13 MED ORDER — CEFAZOLIN SODIUM-DEXTROSE 2-3 GM-%(50ML) IV SOLR
INTRAVENOUS | Status: DC | PRN
  Administered 2024-04-13: 2 g via INTRAVENOUS

## 2024-04-13 MED ORDER — LACTATED RINGERS IV SOLN: INTRAVENOUS | Status: DC

## 2024-04-13 MED ORDER — BUPIVACAINE-EPINEPHRINE (PF) 0.5% -1:200000 IJ SOLN
INTRAMUSCULAR | Status: DC | PRN
  Administered 2024-04-13: 30 mL via PERINEURAL

## 2024-04-13 NOTE — Addendum Note (Signed)
 Addendum  created 04/13/24 1443 by Paul Lamarr BRAVO, MD   Child order released for a procedure order, Clinical Note Signed, Intraprocedure Blocks edited, SmartForm saved

## 2024-04-13 NOTE — Anesthesia Preprocedure Evaluation (Addendum)
"                                    Anesthesia Evaluation  Patient identified by MRN, date of birth, ID band Patient awake    Reviewed: Allergy & Precautions, NPO status , Patient's Chart, lab work & pertinent test results  History of Anesthesia Complications Negative for: history of anesthetic complications  Airway Mallampati: II  TM Distance: >3 FB Neck ROM: Full    Dental  (+) Missing,    Pulmonary Patient abstained from smoking., former smoker   Pulmonary exam normal        Cardiovascular negative cardio ROS Normal cardiovascular exam     Neuro/Psych    Depression    negative neurological ROS     GI/Hepatic negative GI ROS, Neg liver ROS,,,  Endo/Other  negative endocrine ROS    Renal/GU negative Renal ROS     Musculoskeletal negative musculoskeletal ROS (+)    Abdominal   Peds  Hematology negative hematology ROS (+)   Anesthesia Other Findings Left breast ca  Reproductive/Obstetrics                              Anesthesia Physical Anesthesia Plan  ASA: 2  Anesthesia Plan: General   Post-op Pain Management: Tylenol  PO (pre-op)* and Regional block*   Induction: Intravenous  PONV Risk Score and Plan: 3 and Treatment may vary due to age or medical condition, Ondansetron , Dexamethasone  and Midazolam   Airway Management Planned: LMA  Additional Equipment: None  Intra-op Plan:   Post-operative Plan: Extubation in OR  Informed Consent: I have reviewed the patients History and Physical, chart, labs and discussed the procedure including the risks, benefits and alternatives for the proposed anesthesia with the patient or authorized representative who has indicated his/her understanding and acceptance.     Dental advisory given  Plan Discussed with: CRNA  Anesthesia Plan Comments:          Anesthesia Quick Evaluation  "

## 2024-04-13 NOTE — Op Note (Signed)
" ° °  TKAI LARGE 04/13/2024   Pre-op Diagnosis: LEFT BREAST CANCER     Post-op Diagnosis: LEFT BREAST CANCER  Procedures: RADIOACTIVE SEED GUIDED LEFT BREAST LUMPECTOMY DEEP LEFT AXILLARY SENTINEL LYMPH NODE EXCISIONAL BIOPSY INJECTION OF MAG TRACE FOR LYMPH NODE MAPPING  Surgeon(s): Vernetta Berg, MD  Anesthesia: General  Staff:  Circulator: Elaine Avelina PARAS, RN Scrub Person: Florene Dagoberto MATSU, RN  Estimated Blood Loss: Minimal               Specimens: sent to path  Indications: This is a 54 year old female was found to have a small 8 mm mass at the 9 o'clock position of the left breast.  Biopsy of the mass showed invasive ductal carcinoma.  The decision was made to proceed with a radioactive seed guided left breast lumpectomy and deep left axillary sentinel lymph node excisional biopsy  Procedure: The patient was brought to the operating identified the correct patient.  She was placed upon the operating table and general anesthesia was induced.  I injected MAC trace underneath the left nipple areolar complex and massaged the breast.  Her left breast and axilla were then prepped and draped in usual sterile fashion.  Using the neoprobe I located the radioactive seed at the edge of the areola of the left breast at the 9 o'clock position.  I anesthetized the edge of the areola with Marcaine  and made a circumareolar incision with a scalpel.  I then dissected down to the breast tissue with electrocautery.  I then undermined and the skin underneath the areola and then laterally in the breast until I was both medial and lateral to the signal from the radioactive seed I then dissected circumferentially with electrocautery going down to the chest wall attempting to stay wildly around the signal from the radioactive seed using the neoprobe.  I then completed the lumpectomy dissecting posterior to the segment with the cautery.  Once the lumpectomy specimen was removed I marked all margins  with paint.  An x-ray was performed on the specimen confirming the radioactive seed and previous biopsy clip in the specimen.  The specimen was then sent to pathology for evaluation.  I next anesthetized the skin of the left axilla with Marcaine  and made a longitudinal station with a scalpel.  I then dissected down into the deep axillary tissue with the cautery.  Using the mag trace probe I then identified what appeared to be 2-3 sentinel lymph node track together.  I grasped these with an Allis clamp and then excised the lymph nodes en bloc using the cautery and surgical clips.  The lymph nodes were then sent to pathology for evaluation.  There was no other increased uptake of mag trace in the axilla after removal of these nodes.  Hemostasis appeared to be achieved in the axilla.  I then injected the lumpectomy cavity with further Marcaine  and then place surgical clips around the lumpectomy cavity.  I then closed both incisions with interrupted 3-0 Vicryl sutures and running 4-0 Monocryl.  Dermabond was then applied.  The patient tolerated the procedure well.  All the counts were correct at the end of the procedure.  The patient was then extubated in the operating room and taken in a stable condition to the recovery room.          Berg Vernetta   Date: 04/13/2024  Time: 2:09 PM    "

## 2024-04-13 NOTE — Transfer of Care (Signed)
 Immediate Anesthesia Transfer of Care Note  Patient: [name]  Procedure(s) Performed: BREAST LUMPECTOMY WITH RADIOACTIVE SEED AND SENTINEL LYMPH NODE BIOPSY (Left: Breast)  Patient Location: PACU  Anesthesia Type:General  Level of Consciousness: awake, alert , and oriented  Airway & Oxygen Therapy: Patient Spontanous Breathing and Patient connected to face mask oxygen  Post-op Assessment: Report given to RN and Post -op Vital signs reviewed and stable  Post vital signs: Reviewed and stable  Last Vitals:  Vitals Value Taken Time  BP    Temp    Pulse 81 04/13/24 14:21  Resp 11 04/13/24 14:21  SpO2 100 % 04/13/24 14:21  Vitals shown include unfiled device data.  Last Pain:  Vitals:   04/13/24 1107  TempSrc: Oral  PainSc: 0-No pain         Complications: No notable events documented.

## 2024-04-13 NOTE — Discharge Instructions (Addendum)
 [facility] Office Phone Number [phone]  BREAST BIOPSY/ PARTIAL MASTECTOMY: POST OP INSTRUCTIONS  Always review your discharge instruction sheet given to you by the facility where your surgery was performed.  IF YOU HAVE DISABILITY OR FAMILY LEAVE FORMS, YOU MUST BRING THEM TO THE OFFICE FOR PROCESSING.  DO NOT GIVE THEM TO YOUR DOCTOR.  A prescription for pain medication may be given to you upon discharge.  Take your pain medication as prescribed, if needed.  If narcotic pain medicine is not needed, then you may take acetaminophen  (Tylenol ) or ibuprofen (Advil) as needed. Take your usually prescribed medications unless otherwise directed If you need a refill on your pain medication, please contact your pharmacy.  They will contact our office to request authorization.  Prescriptions will not be filled after 5pm or on week-ends. You should eat very light the first 24 hours after surgery, such as soup, crackers, pudding, etc.  Resume your normal diet the day after surgery. Most patients will experience some swelling and bruising in the breast.  Ice packs and a good support bra will help.  Swelling and bruising can take several days to resolve.  It is common to experience some constipation if taking pain medication after surgery.  Increasing fluid intake and taking a stool softener will usually help or prevent this problem from occurring.  A mild laxative (Milk of Magnesia or Miralax) should be taken according to package directions if there are no bowel movements after 48 hours. Unless discharge instructions indicate otherwise, you may remove your bandages 24-48 hours after surgery, and you may shower at that time.  You may have steri-strips (small skin tapes) in place directly over the incision.  These strips should be left on the skin for 7-10 days.  If your surgeon used skin glue on the incision, you may shower in 24 hours.  The glue will flake off over the next 2-3 weeks.  Any  sutures or staples will be removed at the office during your follow-up visit. ACTIVITIES:  You may resume regular daily activities (gradually increasing) beginning the next day.  Wearing a good support bra or sports bra minimizes pain and swelling.  You may have sexual intercourse when it is comfortable. You may drive when you no longer are taking prescription pain medication, you can comfortably wear a seatbelt, and you can safely maneuver your car and apply brakes. RETURN TO WORK:  ______________________________________________________________________________________ [name] should see your doctor in the office for a follow-up appointment approximately two weeks after your surgery.  Your doctors nurse will typically make your follow-up appointment when she calls you with your pathology report.  Expect your pathology report 2-3 business days after your surgery.  You may call to check if you do not hear from us  after three days. OTHER INSTRUCTIONS: YOU MAY REMOVE THE BINDER AND SHOWER STARTING TOMORROW AND THEN PUT THE BINDER BACK ON ICE PACK, TYLENOL , AND IBUPROFEN ALSO FOR PAIN NO VIGOROUS ACTIVITY FOR ONE WEEK _______________________________________________________________________________________________ _____________________________________________________________________________________________________________________________________ _____________________________________________________________________________________________________________________________________ _____________________________________________________________________________________________________________________________________  WHEN TO CALL YOUR DOCTOR: Fever over 101.0 Nausea and/or vomiting. Extreme swelling or bruising. Continued bleeding from incision. Increased pain, redness, or drainage from the incision.  The clinic staff is available to answer your questions during regular business hours.  Please dont hesitate to  call and ask to speak to one of the nurses for clinical concerns.  If you have a medical emergency, go to the nearest emergency room or call 911.  A surgeon from [facility] is always  on call at the hospital.  For further questions, please visit [email or web address]   **No Tylenol  until after 5:15 pm  Post Anesthesia Home Care Instructions  Activity: Get plenty of rest for the remainder of the day. A responsible individual must stay with you for 24 hours following the procedure.  For the next 24 hours, DO NOT: -Drive a car -Advertising copywriter -Drink alcoholic beverages -Take any medication unless instructed by your physician -Make any legal decisions or sign important papers.  Meals: Start with liquid foods such as gelatin or soup. Progress to regular foods as tolerated. Avoid greasy, spicy, heavy foods. If nausea and/or vomiting occur, drink only clear liquids until the nausea and/or vomiting subsides. Call your physician if vomiting continues.  Special Instructions/Symptoms: Your throat may feel dry or sore from the anesthesia or the breathing tube placed in your throat during surgery. If this causes discomfort, gargle with warm salt water. The discomfort should disappear within 24 hours.  If you had a scopolamine patch placed behind your ear for the management of post- operative nausea and/or vomiting:  1. The medication in the patch is effective for 72 hours, after which it should be removed.  Wrap patch in a tissue and discard in the trash. Wash hands thoroughly with soap and water. 2. You may remove the patch earlier than 72 hours if you experience unpleasant side effects which may include dry mouth, dizziness or visual disturbances. 3. Avoid touching the patch. Wash your hands with soap and water after contact with the patch.

## 2024-04-13 NOTE — Anesthesia Postprocedure Evaluation (Signed)
"   Anesthesia Post Note  Patient: [name]  Procedure(s) Performed: BREAST LUMPECTOMY WITH RADIOACTIVE SEED AND SENTINEL LYMPH NODE BIOPSY (Left: Breast)     Patient location during evaluation: PACU Anesthesia Type: General Level of consciousness: awake and alert Pain management: pain level controlled Vital Signs Assessment: post-procedure vital signs reviewed and stable Respiratory status: spontaneous breathing, nonlabored ventilation and respiratory function stable Cardiovascular status: blood pressure returned to baseline Postop Assessment: no apparent nausea or vomiting Anesthetic complications: no   No notable events documented.  Last Vitals:  Vitals:   04/13/24 1107 04/13/24 1421  BP: 122/81   Pulse: 81   Resp: 18   Temp: 36.9 C (!) 36.3 C  SpO2: 99%     Last Pain:  Vitals:   04/13/24 1421  TempSrc:   PainSc: Asleep                 Vertell Row      "

## 2024-04-13 NOTE — Interval H&P Note (Signed)
 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  The various methods of treatment have been discussed with the patient and family. After consideration of risks, benefits and other options for treatment, the patient has consented to  Procedures with comments: BREAST LUMPECTOMY WITH RADIOACTIVE SEED AND SENTINEL LYMPH NODE BIOPSY (Left) - LMA as a surgical intervention.  The patient's history has been reviewed, patient examined, no change in status, stable for surgery.  I have reviewed the patient's chart and labs.  Questions were answered to the patient's satisfaction.     [name]

## 2024-04-13 NOTE — Anesthesia Procedure Notes (Signed)
 Procedure Name: LMA Insertion Date/Time: 04/13/2024 1:17 PM  Performed by: Burnard Rosaline HERO, CRNAPre-anesthesia Checklist: Patient identified, Emergency Drugs available, Suction available and Patient being monitored Patient Re-evaluated:Patient Re-evaluated prior to induction Oxygen Delivery Method: Circle system utilized Preoxygenation: Pre-oxygenation with 100% oxygen Induction Type: IV induction Ventilation: Mask ventilation without difficulty LMA: LMA inserted LMA Size: 4.0 Number of attempts: 1 Airway Equipment and Method: Bite block Placement Confirmation: positive ETCO2, breath sounds checked- equal and bilateral and CO2 detector Tube secured with: Tape Dental Injury: Teeth and Oropharynx as per pre-operative assessment

## 2024-04-13 NOTE — Anesthesia Procedure Notes (Signed)
 Anesthesia Regional Block: Pectoralis block   Pre-Anesthetic Checklist: , timeout performed,  Correct Patient, Correct Site, Correct Laterality,  Correct Procedure, Correct Position, site marked,  Risks and benefits discussed,  Pre-op evaluation,  At surgeon's request and post-op pain management  Laterality: Left  Prep: Maximum Sterile Barrier Precautions used, chloraprep       Needles:  Injection technique: Single-shot  Needle Type: Echogenic Stimulator Needle     Needle Length: 9cm  Needle Gauge: 22     Additional Needles:   Procedures:,,,, ultrasound used (permanent image in chart),,    Narrative:  Start time: 04/13/2024 12:36 PM End time: 04/13/2024 12:39 PM Injection made incrementally with aspirations every 5 mL.  Performed by: Personally  Anesthesiologist: Paul Lamarr BRAVO, MD  Additional Notes: Risks, benefits, and alternative discussed. Patient gave consent for procedure. Patient prepped and draped in sterile fashion. Sedation administered, patient remains easily responsive to voice. Relevant anatomy identified with ultrasound guidance. Local anesthetic given in 5cc increments with no signs or symptoms of intravascular injection. No pain or paraesthesias with injection. Patient monitored throughout procedure with no signs of LAST or immediate complications. Tolerated well. Ultrasound image placed in chart.  LANEY Paul, MD

## 2024-04-13 NOTE — Progress Notes (Signed)
 Assisted Dr. Stephannie Peters with left, pectoralis, ultrasound guided block. Side rails up, monitors on throughout procedure. See vital signs in flow sheet. Tolerated Procedure well.

## 2024-04-14 ENCOUNTER — Encounter (HOSPITAL_BASED_OUTPATIENT_CLINIC_OR_DEPARTMENT_OTHER): Payer: Self-pay | Admitting: Surgery

## 2024-04-16 ENCOUNTER — Encounter: Payer: Self-pay | Admitting: *Deleted

## 2024-04-17 LAB — SURGICAL PATHOLOGY

## 2024-04-18 ENCOUNTER — Encounter: Payer: Self-pay | Admitting: *Deleted

## 2024-04-18 ENCOUNTER — Telehealth: Payer: Self-pay | Admitting: *Deleted

## 2024-04-18 ENCOUNTER — Inpatient Hospital Stay: Admitting: Licensed Clinical Social Worker

## 2024-04-18 NOTE — Telephone Encounter (Signed)
 Ordered oncotype per Dr. Loretha. Requisition sent to [facility] [employer]

## 2024-04-18 NOTE — Progress Notes (Signed)
 [facility] Clinical Social Work  Initial Assessment   [name] is a 54 y.o. year old female contacted by phone. Clinical Social Work was referred by new patient protocol for assessment of psychosocial needs.   SDOH (Social Determinants of Health) assessments performed: Yes SDOH Interventions    Flowsheet Row Clinical Support from 04/18/2024 in [facility] Office Visit from 12/30/2022 in [facility]  SDOH Interventions    Food Insecurity Interventions Intervention Not Indicated --  Housing Interventions Intervention Not Indicated --  Transportation Interventions Intervention Not Indicated --  Utilities Interventions Intervention Not Indicated --  Depression Interventions/Treatment  -- Counseling    SDOH Screenings   Food Insecurity: No Food Insecurity (04/11/2024)  Housing: Low Risk (04/11/2024)  Transportation Needs: No Transportation Needs (04/11/2024)  Utilities: Not At Risk (04/11/2024)  Alcohol Screen: Low Risk (05/06/2023)  Depression (PHQ2-9): Low Risk (04/11/2024)  Financial Resource Strain: Low Risk (05/06/2023)  Physical Activity: Insufficiently Active (05/06/2023)  Social Connections: Unknown (05/06/2023)  Stress: Stress Concern Present (05/06/2023)  Tobacco Use: Medium Risk (04/13/2024)    PHQ 2/9:    04/11/2024    3:18 PM 05/06/2023   10:40 AM 12/30/2022    8:32 AM  Depression screen PHQ 2/9  Decreased Interest 0 0 1  Down, Depressed, Hopeless 0 0 1  PHQ - 2 Score 0 0 2  Altered sleeping   1  Tired, decreased energy   1  Change in appetite   1  Feeling bad or failure about yourself    1  Trouble concentrating   1  Moving slowly or fidgety/restless   0  Suicidal thoughts   0  PHQ-9 Score   7   Difficult doing work/chores   Somewhat difficult     Data saved with a previous flowsheet row definition     Distress Screen completed: No     No data to display             Family/Social Information:  Housing Arrangement: patient lives with her spouse Family members/support persons in your life? Family and Friends Transportation concerns: no  Employment: Working full time. She can take time off as needed for recovery.  Income source: Employment Financial concerns: No Type of concern: None Food access concerns: no Religious or spiritual practice: Not known Services Currently in place:  [facility]  Coping/ Adjustment to diagnosis: Patient understands treatment plan and what happens next? yes, she is currently recovering from surgery. Doing well other than being tired of being stuck in the house. She plans to try going in to work for a couple of hours tomorrow Concerns about diagnosis and/or treatment: I'm not especially worried about anything Current coping skills/ strengths: Ability for insight , Capable of independent living , Communication skills , Motivation for treatment/growth , and Supportive family/friends     SUMMARY: Current SDOH Barriers:  No major barriers identified today  Clinical Social Work Clinical Goal(s):  No clinical social work goals at this time  Interventions: Discussed common feeling and emotions when being diagnosed with cancer, and the importance of support during treatment Informed patient of the support team roles and support services at [facility] Provided CSW contact information and encouraged patient to call with any questions or concerns   Follow Up Plan: Patient will contact CSW with any support or resource needs Patient verbalizes understanding of plan: Yes    Jamillah Camilo E Carlosdaniel Grob, LCSW  Clinical Social Worker [facility]

## 2024-04-20 ENCOUNTER — Telehealth: Payer: Self-pay | Admitting: Radiation Oncology

## 2024-04-20 ENCOUNTER — Encounter: Payer: Self-pay | Admitting: *Deleted

## 2024-04-20 NOTE — Telephone Encounter (Signed)
 Pt called inquiring about f/u with Dr. Maritza. Pt states since completing sx, she already has next appt scheduled with Dr. Loretha but has not received a call from RadOnc. Pt was advised that we usually wait for a referral from the surgeon or medical oncologist to let us  know if we need to take next steps with radiation. I advised at this time I will reach out to nurse navigators to submit referral if needed and/or contact pt to advise her of next steps. At this time, no referral entered for RadOnc. Pt verbalized understanding and awaits call.

## 2024-04-20 NOTE — Progress Notes (Signed)
 Patient had questions about when she would go back to Rad Onc. Called her and told her the oncotype testing has been sent out with an estimated return date of 2/19. She has f/u appt with Dr. Nasario on 2/24. Will be referred back to XRT once oncotype results have come back. Patient verbalizes understanding.

## 2024-05-04 ENCOUNTER — Inpatient Hospital Stay

## 2024-05-04 ENCOUNTER — Inpatient Hospital Stay: Admitting: Genetic Counselor

## 2024-05-04 ENCOUNTER — Ambulatory Visit: Payer: Self-pay | Admitting: Rehabilitation

## 2024-05-09 ENCOUNTER — Inpatient Hospital Stay: Admitting: Hematology and Oncology
